# Patient Record
Sex: Male | Born: 1970 | Hispanic: Yes | Smoking: Never smoker
Health system: Southern US, Community
[De-identification: ages and names within clinical notes are randomized; demographics above are authoritative.]

## PROBLEM LIST (undated history)

## (undated) DIAGNOSIS — K2970 Gastritis, unspecified, without bleeding: Secondary | ICD-10-CM

## (undated) DIAGNOSIS — E785 Hyperlipidemia, unspecified: Secondary | ICD-10-CM

## (undated) DIAGNOSIS — E7800 Pure hypercholesterolemia, unspecified: Secondary | ICD-10-CM

## (undated) HISTORY — PX: APPENDECTOMY: SHX54

## (undated) HISTORY — PX: HERNIA REPAIR: SHX51

---

## 2018-06-08 ENCOUNTER — Encounter: Payer: Self-pay | Admitting: Emergency Medicine

## 2018-06-08 ENCOUNTER — Emergency Department: Payer: Self-pay

## 2018-06-08 ENCOUNTER — Other Ambulatory Visit: Payer: Self-pay

## 2018-06-08 ENCOUNTER — Emergency Department
Admission: EM | Admit: 2018-06-08 | Discharge: 2018-06-08 | Disposition: A | Discharge status: Home / Self Care | Payer: Self-pay | Attending: Emergency Medicine | Admitting: Emergency Medicine

## 2018-06-08 DIAGNOSIS — R079 Chest pain, unspecified: Secondary | ICD-10-CM | POA: Insufficient documentation

## 2018-06-08 LAB — CBC
HCT: 39.8 % (ref 39.0–52.0)
Hemoglobin: 13.6 g/dL (ref 13.0–17.0)
MCH: 30.1 pg (ref 26.0–34.0)
MCHC: 34.2 g/dL (ref 30.0–36.0)
MCV: 88.1 fL (ref 80.0–100.0)
Platelets: 251 10*3/uL (ref 150–400)
RBC: 4.52 MIL/uL (ref 4.22–5.81)
RDW: 12.4 % (ref 11.5–15.5)
WBC: 8 10*3/uL (ref 4.0–10.5)
nRBC: 0 % (ref 0.0–0.2)

## 2018-06-08 LAB — TROPONIN I: Troponin I: <0.03 ng/mL (ref <0.03)

## 2018-06-08 LAB — BASIC METABOLIC PANEL
Anion gap: 8 (ref 5–15)
BUN: 10 mg/dL (ref 6–20)
CO2: 24 mmol/L (ref 22–32)
Calcium: 8.6 mg/dL — ABNORMAL LOW (ref 8.9–10.3)
Chloride: 107 mmol/L (ref 98–111)
Creatinine, Ser: 0.7 mg/dL (ref 0.61–1.24)
GFR calc Af Amer: >60 mL/min (ref >60)
GFR calc non Af Amer: >60 mL/min (ref >60)
Glucose, Bld: 127 mg/dL — ABNORMAL HIGH (ref 70–99)
Potassium: 3.5 mmol/L (ref 3.5–5.1)
Sodium: 139 mmol/L (ref 135–145)

## 2018-06-08 NOTE — ED Triage Notes (Signed)
Patient ambulatory to triage with steady gait, without difficulty or distress noted, mask in place; per video interpreter, pt reports left upper CP, at times radiating into left arm for several years with no accomp symptoms; st he is wanting a physical and his cholesterol checked

## 2018-06-08 NOTE — ED Notes (Signed)
Patient is Jesus Moon. Asymptomatic. AAOX4.

## 2018-06-08 NOTE — ED Provider Notes (Signed)
[facility] Emergency Department Provider Note    First MD Initiated Contact with Patient 06/08/18 [phone]     (approximate)  I have reviewed the triage vital signs and the nursing notes.   HISTORY  Chief Complaint Chest Pain    HPI Katha Cabalndres Mederos Augustin SchoolingSantamaria is a 48 y.o. male with history of hypercholesterolemia with history of hyperlipidemia presents to the emergency department with complaint of nonradiating left chest pain for 5 years.  Patient denies any aggravating or alleviating factors.  Patient denies any dyspnea no diaphoresis.  Patient denies any nausea or vomiting.       History reviewed. No pertinent past medical history.  There are no active problems to display for this patient.   Past Surgical History:  Procedure Laterality Date  . APPENDECTOMY      Prior to Admission medications   Not on File    Allergies Patient has no known allergies.  No family history on file.  Social History Social History   Tobacco Use  . Smoking status: Never Smoker  . Smokeless tobacco: Never Used  Substance Use Topics  . Alcohol use: Not on file  . Drug use: Not on file    Review of Systems Constitutional: No fever/chills Eyes: No visual changes. ENT: No sore throat. Cardiovascular: Positive for chest pain. Respiratory: Denies shortness of breath. Gastrointestinal: No abdominal pain.  No nausea, no vomiting.  No diarrhea.  No constipation. Genitourinary: Negative for dysuria. Musculoskeletal: Negative for neck pain.  Negative for back pain. Integumentary: Negative for rash. Neurological: Negative for headaches, focal weakness or numbness.  ____________________________________________   PHYSICAL EXAM:  VITAL SIGNS: ED Triage Vitals  Enc Vitals Group     BP 06/08/18 0006 (!) 144/83     Pulse Rate 06/08/18 0006 (!) 54     Resp 06/08/18 0006 18     Temp 06/08/18 0006 98 F (36.7 C)     Temp Source 06/08/18 0006 Oral     SpO2  06/08/18 0006 97 %     Weight 06/08/18 0012 65.8 kg (145 lb)     Height 06/08/18 0012 1.651 m (5\' 5" )     Head Circumference --      Peak Flow --      Pain Score 06/08/18 0011 6     Pain Loc --      Pain Edu? --      Excl. in GC? --     Constitutional: Alert and oriented. Well appearing and in no acute distress. Eyes: Conjunctivae are normal.  Mouth/Throat: Mucous membranes are moist.  Oropharynx non-erythematous. Neck: No stridor.   Cardiovascular: Normal rate, regular rhythm. Good peripheral circulation. Grossly normal heart sounds. Respiratory: Normal respiratory effort.  No retractions. No audible wheezing. Gastrointestinal: Soft and nontender. No distention.  Musculoskeletal: No lower extremity tenderness nor edema. No gross deformities of extremities. Neurologic:  Normal speech and language. No gross focal neurologic deficits are appreciated.  Skin:  Skin is warm, dry and intact. No rash noted. Psychiatric: Mood and affect are normal. Speech and behavior are normal.  ____________________________________________   LABS (all labs ordered are listed, but only abnormal results are displayed)  Labs Reviewed  BASIC METABOLIC PANEL - Abnormal; Notable for the following components:      Result Value   Glucose, Bld 127 (*)    Calcium 8.6 (*)    All other components within normal limits  CBC  TROPONIN I   ____________________________________________  EKG  ED ECG REPORT  I, [place] Dewayne Shorter, the attending physician, personally viewed and interpreted this ECG.   Date: 06/08/2018  EKG Time: 12:18 AM  Rate: 54  Rhythm: Normal sinus rhythm  Axis: Normal  Intervals:Normal  ST&T Change: None  ____________________________________________  RADIOLOGY I, [place] Jaquisha Frech, personally viewed and evaluated these images (plain radiographs) as part of my medical decision making, as well as reviewing the written report by the radiologist.  ED MD interpretation: No active  cardiopulmonary disease.  Official radiology report(s): Dg Chest Port 1 View  Result Date: 06/08/2018 CLINICAL DATA:  Chest pain EXAM: PORTABLE CHEST 1 VIEW COMPARISON:  None. FINDINGS: The heart size and mediastinal contours are within normal limits. Both lungs are clear. The visualized skeletal structures are unremarkable. IMPRESSION: No active disease. Electronically Signed   By: Deatra Robinson M.D.   On: 06/08/2018 01:48     Procedures   ____________________________________________   INITIAL IMPRESSION / MDM / ASSESSMENT AND PLAN / ED COURSE  As part of my medical decision making, I reviewed the following data within the electronic MEDICAL RECORD NUMBER  48 year old male presented with above-stated history and physical exam secondary to chest pain considered possibly of CAD/MI however patient's EKG revealed no evidence of ischemia or infarction.  Troponin negative.  Chest x-ray likewise negative.  Patient referred to cardiology for further outpatient evaluation.  *Nyko Altamirano was evaluated in Emergency Department on 06/08/2018 for the symptoms described in the history of present illness. He was evaluated in the context of the global COVID-19 pandemic, which necessitated consideration that the patient might be at risk for infection with the SARS-CoV-2 virus that causes COVID-19. Institutional protocols and algorithms that pertain to the evaluation of patients at risk for COVID-19 are in a state of rapid change based on information released by regulatory bodies including the CDC and federal and state organizations. These policies and algorithms were followed during the patient's care in the ED.  Some ED evaluations and interventions may be delayed as a result of limited staffing during the pandemic.*    ____________________________________________  FINAL CLINICAL IMPRESSION(S) / ED DIAGNOSES  Final diagnoses:  Chest pain, unspecified type     MEDICATIONS GIVEN DURING THIS  VISIT:  Medications - No data to display   ED Discharge Orders    None       Note:  This document was prepared using Dragon voice recognition software and may include unintentional dictation errors.   Darci Current, MD 06/08/18 [phone]

## 2018-06-08 NOTE — ED Notes (Signed)
Pt to the er for answers to some chronic health questions. Pt states he has had high cholesterol and pain in his left chest for 5 years. Pt states he has pain tonight as well that comes and goes and rates it a 6 to a 7. Pt denis SOB, nausea, vomiting. Pt saw his doctor a few days ago and was given something for pain. Interpreter used.

## 2018-06-10 ENCOUNTER — Other Ambulatory Visit: Payer: Self-pay | Admitting: Internal Medicine

## 2018-06-10 DIAGNOSIS — I208 Other forms of angina pectoris: Secondary | ICD-10-CM

## 2018-06-14 ENCOUNTER — Other Ambulatory Visit: Payer: Self-pay | Admitting: Internal Medicine

## 2018-06-14 DIAGNOSIS — I208 Other forms of angina pectoris: Secondary | ICD-10-CM

## 2018-06-14 DIAGNOSIS — R0602 Shortness of breath: Secondary | ICD-10-CM

## 2018-06-16 ENCOUNTER — Ambulatory Visit: Payer: Self-pay

## 2018-06-20 ENCOUNTER — Ambulatory Visit: Admission: RE | Admit: 2018-06-20 | Payer: Self-pay | Source: Ambulatory Visit

## 2019-08-13 ENCOUNTER — Other Ambulatory Visit: Payer: Self-pay | Admitting: Internal Medicine

## 2019-08-13 DIAGNOSIS — R0602 Shortness of breath: Secondary | ICD-10-CM

## 2019-08-13 DIAGNOSIS — I208 Other forms of angina pectoris: Secondary | ICD-10-CM

## 2020-02-10 ENCOUNTER — Emergency Department: Payer: 59

## 2020-02-10 ENCOUNTER — Encounter: Payer: Self-pay | Admitting: Emergency Medicine

## 2020-02-10 ENCOUNTER — Emergency Department
Admission: EM | Admit: 2020-02-10 | Discharge: 2020-02-10 | Disposition: A | Discharge status: Home / Self Care | Payer: 59 | Attending: Emergency Medicine | Admitting: Emergency Medicine

## 2020-02-10 ENCOUNTER — Other Ambulatory Visit: Payer: Self-pay

## 2020-02-10 DIAGNOSIS — R519 Headache, unspecified: Secondary | ICD-10-CM | POA: Diagnosis present

## 2020-02-10 DIAGNOSIS — M542 Cervicalgia: Secondary | ICD-10-CM | POA: Diagnosis not present

## 2020-02-10 DIAGNOSIS — M79602 Pain in left arm: Secondary | ICD-10-CM | POA: Insufficient documentation

## 2020-02-10 DIAGNOSIS — R0789 Other chest pain: Secondary | ICD-10-CM | POA: Insufficient documentation

## 2020-02-10 HISTORY — DX: Hyperlipidemia, unspecified: E78.5

## 2020-02-10 LAB — CBC
HCT: 42.5 % (ref 39.0–52.0)
Hemoglobin: 14.8 g/dL (ref 13.0–17.0)
MCH: 30.2 pg (ref 26.0–34.0)
MCHC: 34.8 g/dL (ref 30.0–36.0)
MCV: 86.7 fL (ref 80.0–100.0)
Platelets: 229 10*3/uL (ref 150–400)
RBC: 4.9 MIL/uL (ref 4.22–5.81)
RDW: 13.2 % (ref 11.5–15.5)
WBC: 6.5 10*3/uL (ref 4.0–10.5)
nRBC: 0 % (ref 0.0–0.2)

## 2020-02-10 LAB — BASIC METABOLIC PANEL
Anion gap: 7 (ref 5–15)
BUN: 13 mg/dL (ref 6–20)
CO2: 24 mmol/L (ref 22–32)
Calcium: 8.8 mg/dL — ABNORMAL LOW (ref 8.9–10.3)
Chloride: 105 mmol/L (ref 98–111)
Creatinine, Ser: 0.63 mg/dL (ref 0.61–1.24)
GFR, Estimated: >60 mL/min (ref >60)
Glucose, Bld: 113 mg/dL — ABNORMAL HIGH (ref 70–99)
Potassium: 3.9 mmol/L (ref 3.5–5.1)
Sodium: 136 mmol/L (ref 135–145)

## 2020-02-10 LAB — TROPONIN I (HIGH SENSITIVITY)
Troponin I (High Sensitivity): 3 ng/L (ref <18)
Troponin I (High Sensitivity): 2 ng/L (ref <18)

## 2020-02-10 MED ORDER — SODIUM CHLORIDE 0.9 % IV BOLUS (SEPSIS)
1000.0000 mL | Freq: Once | INTRAVENOUS | Status: AC
  Administered 2020-02-10: 1000 mL via INTRAVENOUS

## 2020-02-10 MED ORDER — DIPHENHYDRAMINE HCL 50 MG/ML IJ SOLN
25.0000 mg | Freq: Once | INTRAMUSCULAR | Status: AC
  Administered 2020-02-10: 25 mg via INTRAVENOUS
  Filled 2020-02-10: qty 1

## 2020-02-10 MED ORDER — METOCLOPRAMIDE HCL 5 MG/ML IJ SOLN
10.0000 mg | Freq: Once | INTRAMUSCULAR | Status: AC
  Administered 2020-02-10: 10 mg via INTRAVENOUS
  Filled 2020-02-10: qty 2

## 2020-02-10 MED ORDER — KETOROLAC TROMETHAMINE 30 MG/ML IJ SOLN
30.0000 mg | Freq: Once | INTRAMUSCULAR | Status: AC
  Administered 2020-02-10: 30 mg via INTRAVENOUS
  Filled 2020-02-10: qty 1

## 2020-02-10 NOTE — Discharge Instructions (Signed)
You may alternate Tylenol 1000 mg every 6 hours as needed for pain, fever and Ibuprofen 800 mg every 8 hours as needed for pain, fever.  Please take Ibuprofen with food.  Do not take more than 4000 mg of Tylenol (acetaminophen) in a 24 hour period.   Steps to find a Primary Care Provider (PCP):  Call [phone] or [phone] to access "[facility] Find a Doctor Service."  2.  You may also go on the [facility] website at [email or web address]  

## 2020-02-10 NOTE — ED Notes (Signed)
Pt also reports he has a headache and felt lower lip to left side like pins on just one area of the lip

## 2020-02-10 NOTE — ED Provider Notes (Signed)
[facility] Emergency Department Provider Note  ____________________________________________   Event Date/Time   First MD Initiated Contact with Patient 02/10/20 [phone]     (approximate)  I have reviewed the triage vital signs and the nursing notes.   HISTORY  Chief Complaint Chest Pain    HPI Jesus Moon is a 50 y.o. male hyperlipidemia who presents to the emergency department with intermittent throbbing left-sided headaches that radiate down the left arm and neck and across his chest.  He reports feeling burning in his fingertips and toes.  States he had tingling in his lips yesterday.  No current numbness, tingling or weakness.  No neck stiffness, fever.  No difficulty breathing.  Was seen at [facility] for the same and had a reassuring work-up including negative troponins and negative CT of the head.  He states he has been taking Tylenol and ibuprofen and this has been improving his symptoms.  No history of ACS, PE, DVT.  No lower extremity swelling or pain.        Past Medical History:  Diagnosis Date  . Hyperlipemia     There are no problems to display for this patient.   Past Surgical History:  Procedure Laterality Date  . APPENDECTOMY      Prior to Admission medications   Not on File    Allergies Amoxicillin  No family history on file.  Social History Social History   Tobacco Use  . Smoking status: Never Smoker  . Smokeless tobacco: Never Used    Review of Systems Constitutional: No fever. Eyes: No visual changes. ENT: No sore throat. Cardiovascular: Denies chest pain. Respiratory: Denies shortness of breath. Gastrointestinal: No nausea, vomiting, diarrhea. Genitourinary: Negative for dysuria. Musculoskeletal: Negative for back pain. Skin: Negative for rash. Neurological: Negative for focal weakness or numbness.  ____________________________________________   PHYSICAL EXAM:  VITAL SIGNS: ED Triage Vitals   Enc Vitals Group     BP 02/10/20 0012 137/90     Pulse Rate 02/10/20 0012 (!) 54     Resp 02/10/20 0012 20     Temp 02/10/20 0012 98.2 F (36.8 C)     Temp Source 02/10/20 0012 Oral     SpO2 02/10/20 0012 97 %     Weight 02/10/20 0013 145 lb 1 oz (65.8 kg)     Height 02/10/20 0013 5\' 5"  (1.651 m)     Head Circumference --      Peak Flow --      Pain Score 02/10/20 0012 8     Pain Loc --      Pain Edu? --      Excl. in GC? --    CONSTITUTIONAL: Alert and oriented and responds appropriately to questions. Well-appearing; well-nourished HEAD: Normocephalic EYES: Conjunctivae clear, pupils appear equal, EOM appear intact ENT: normal nose; moist mucous membranes NECK: Supple, normal ROM, no midline spinal tenderness or step-off or deformity, no meningismus, no tenderness over the occiput bilaterally CARD: RRR; S1 and S2 appreciated; no murmurs, no clicks, no rubs, no gallops CHEST:  Chest wall is tender to palpation which reproduces his pain.  No crepitus, ecchymosis, erythema, warmth, rash or other lesions present.   RESP: Normal chest excursion without splinting or tachypnea; breath sounds clear and equal bilaterally; no wheezes, no rhonchi, no rales, no hypoxia or respiratory distress, speaking full sentences ABD/GI: Normal bowel sounds; non-distended; soft, non-tender, no rebound, no guarding, no peritoneal signs, no hepatosplenomegaly BACK: The back appears normal EXT: Normal ROM in  all joints; no deformity noted, no edema; no cyanosis SKIN: Normal color for age and race; warm; no rash on exposed skin NEURO: Moves all extremities equally, normal sensation diffusely, normal speech, cranial nerves II through XII intact PSYCH: The patient's mood and manner are appropriate.  ____________________________________________   LABS (all labs ordered are listed, but only abnormal results are displayed)  Labs Reviewed  BASIC METABOLIC PANEL - Abnormal; Notable for the following components:       Result Value   Glucose, Bld 113 (*)    Calcium 8.8 (*)    All other components within normal limits  CBC  TROPONIN I (HIGH SENSITIVITY)  TROPONIN I (HIGH SENSITIVITY)   ____________________________________________  EKG   EKG Interpretation  Date/Time:  Saturday February 10 2020 00:02:02 EST Ventricular Rate:  50 PR Interval:  170 QRS Duration: 100 QT Interval:  428 QTC Calculation: 390 R Axis:   21 Text Interpretation: Sinus bradycardia Otherwise normal ECG No significant change since last tracing Confirmed by Rochele Raring [phone]) on 02/10/2020 3:30:57 AM       ____________________________________________  RADIOLOGY Normajean Baxter Chaniece Barbato, personally viewed and evaluated these images (plain radiographs) as part of my medical decision making, as well as reviewing the written report by the radiologist.  ED MD interpretation: Chest x-ray shows no acute abnormality.  Official radiology report(s): DG Chest 2 View  Result Date: 02/10/2020 CLINICAL DATA:  Chest pain EXAM: CHEST - 2 VIEW COMPARISON:  06/08/2018 FINDINGS: The heart size and mediastinal contours are within normal limits. Both lungs are clear. The visualized skeletal structures are unremarkable. IMPRESSION: Normal study. Electronically Signed   By: Charlett Nose M.D.   On: 02/10/2020 00:34    ____________________________________________   PROCEDURES  Procedure(s) performed (including Critical Care):  None  ____________________________________________   INITIAL IMPRESSION / ASSESSMENT AND PLAN / ED COURSE  As part of my medical decision making, I reviewed the following data within the electronic MEDICAL RECORD NUMBER Nursing notes reviewed and incorporated, Interpreter needed, Labs reviewed, EKG interpreted bradycardia, Old EKG reviewed, Old chart reviewed, Radiograph reviewed and Notes from prior ED visits         Patient here with headache and very atypical chest pain.  He was seen at [facility] 2 days ago  for the same and had normal labs, chest x-ray and head CT.  Troponin x2 normal here.  Chest x-ray clear.  EKG nonischemic.  Chest pain seems to be musculoskeletal in nature.  Doubt ACS, PE, dissection.  No signs of volume overload.  He is also complaining of a throbbing left-sided headache.  Improving with Tylenol and Motrin.  No focal neurologic deficits.  I do not feel he needs repeat head imaging today.  Doubt stroke, CVT, intracranial hemorrhage, meningitis.  Will give migraine cocktail and reassess.    5:30 AM  Pt reports headache and chest pain completely resolved after migraine cocktail.  Will give outpatient PCP follow-up information.  Discussed return precautions.  Recommended continuing Tylenol Motrin as needed for pain.   At this time, I do not feel there is any life-threatening condition present. I have reviewed, interpreted and discussed all results (EKG, imaging, lab, urine as appropriate) and exam findings with patient/family. I have reviewed nursing notes and appropriate previous records.  I feel the patient is safe to be discharged home without further emergent workup and can continue workup as an outpatient as needed. Discussed usual and customary return precautions. Patient/family verbalize understanding and are comfortable with this plan.  Outpatient follow-up has been provided as needed. All questions have been answered.   ____________________________________________   FINAL CLINICAL IMPRESSION(S) / ED DIAGNOSES  Final diagnoses:  Atypical chest pain  Generalized headache     ED Discharge Orders    None      *Please note:  Jesus Moon was evaluated in Emergency Department on 02/10/2020 for the symptoms described in the history of present illness. He was evaluated in the context of the global COVID-19 pandemic, which necessitated consideration that the patient might be at risk for infection with the SARS-CoV-2 virus that causes COVID-19. Institutional  protocols and algorithms that pertain to the evaluation of patients at risk for COVID-19 are in a state of rapid change based on information released by regulatory bodies including the CDC and federal and state organizations. These policies and algorithms were followed during the patient's care in the ED.  Some ED evaluations and interventions may be delayed as a result of limited staffing during and the pandemic.*   Note:  This document was prepared using Dragon voice recognition software and may include unintentional dictation errors.   Milo Solana, Layla Maw, DO 02/10/20 [phone]

## 2020-02-10 NOTE — ED Triage Notes (Signed)
Pt reports he developed sharp right chest pain, pt reports he went to another hospital yesterday for same symptoms. Pt reports prior to arrival he took Ibuprofen for his chest pain. Pt talks in complete sentences no respiratory distress noted denies any other symptoms

## 2020-05-04 ENCOUNTER — Encounter: Payer: Self-pay | Admitting: Emergency Medicine

## 2020-05-04 ENCOUNTER — Emergency Department: Payer: 59

## 2020-05-04 ENCOUNTER — Emergency Department
Admission: EM | Admit: 2020-05-04 | Discharge: 2020-05-05 | Disposition: A | Discharge status: Home / Self Care | Payer: 59 | Attending: Emergency Medicine | Admitting: Emergency Medicine

## 2020-05-04 ENCOUNTER — Other Ambulatory Visit: Payer: Self-pay

## 2020-05-04 DIAGNOSIS — J309 Allergic rhinitis, unspecified: Secondary | ICD-10-CM | POA: Diagnosis not present

## 2020-05-04 DIAGNOSIS — S5001XA Contusion of right elbow, initial encounter: Secondary | ICD-10-CM | POA: Insufficient documentation

## 2020-05-04 DIAGNOSIS — X500XXA Overexertion from strenuous movement or load, initial encounter: Secondary | ICD-10-CM | POA: Insufficient documentation

## 2020-05-04 DIAGNOSIS — M545 Low back pain, unspecified: Secondary | ICD-10-CM | POA: Diagnosis not present

## 2020-05-04 DIAGNOSIS — M546 Pain in thoracic spine: Secondary | ICD-10-CM | POA: Insufficient documentation

## 2020-05-04 DIAGNOSIS — T148XXA Other injury of unspecified body region, initial encounter: Secondary | ICD-10-CM

## 2020-05-04 DIAGNOSIS — S199XXA Unspecified injury of neck, initial encounter: Secondary | ICD-10-CM | POA: Diagnosis present

## 2020-05-04 DIAGNOSIS — Y990 Civilian activity done for income or pay: Secondary | ICD-10-CM | POA: Insufficient documentation

## 2020-05-04 DIAGNOSIS — S161XXA Strain of muscle, fascia and tendon at neck level, initial encounter: Secondary | ICD-10-CM | POA: Insufficient documentation

## 2020-05-04 MED ORDER — KETOROLAC TROMETHAMINE 30 MG/ML IJ SOLN
30.0000 mg | Freq: Once | INTRAMUSCULAR | Status: AC
  Administered 2020-05-04: 30 mg via INTRAMUSCULAR
  Filled 2020-05-04: qty 1

## 2020-05-04 MED ORDER — CYCLOBENZAPRINE HCL 10 MG PO TABS
10.0000 mg | ORAL_TABLET | Freq: Once | ORAL | Status: AC
  Administered 2020-05-04: 10 mg via ORAL
  Filled 2020-05-04: qty 1

## 2020-05-04 NOTE — ED Triage Notes (Signed)
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 Pt talks in complete setences no distress noted

## 2020-05-04 NOTE — Discharge Instructions (Addendum)
Your exam and x-rays are normal at this time.  Take the prescription muscle relaxants as directed.  You should also use the prescription nasal steroid for your seasonal allergies.  Follow-up with one of the local Dental providers listed below.  See your primary provider for ongoing management of your other symptoms.  Su examen y radiografas son normales en este momento. Tome los relajantes musculares recetados segn las indicaciones. Tambin debe usar el esteroide nasal recetado para sus Theatre manager. Haga un seguimiento con uno de los proveedores dentales locales que se enumeran a continuacin. Consulte a su proveedor primario para el control continuo de sus otros sntomas. OPTIONS FOR DENTAL FOLLOW UP CARE  [place] Department of Health and Human Services - Local Safety Net Dental Clinics TripDoors.com.htm   [facility] [phone])  Sharl Ma [phone])  [place] [phone] ext [phone])  [facility] [phone])  [facility] [phone]) This clinic caters to the indigent population and is on a lottery system. Location: [employer] [facility], [employer], [address], [place] Clinic Hours: Wednesdays from 6pm - 9pm, patients seen by a lottery system. For dates, call or go to [email or web address] Services: Cleanings, fillings and simple extractions. Payment Options: DENTAL WORK IS FREE OF CHARGE. Bring proof of income or support. Best way to get seen: Arrive at 5:15 pm - this is a lottery, NOT first come/first serve, so arriving earlier will not increase your chances of being seen.     [facility] [phone] Select option 1 for emergencies   Location: [facility], [place], [address], [place] Clinic Hours: No walk-ins accepted - call the day before to schedule an  appointment. Check in times are 9:30 am and 1:30 pm. Services: Simple extractions, temporary fillings, pulpectomy/pulp debridement, uncomplicated abscess drainage. Payment Options: PAYMENT IS DUE AT THE TIME OF SERVICE.  Fee is usually $100-200, additional surgical procedures (e.g. abscess drainage) may be extra. Cash, checks, Visa/MasterCard accepted.  Can file Medicaid if patient is covered for dental - patient should call case worker to check. No discount for [facility] patients. Best way to get seen: MUST call the day before and get onto the schedule. Can usually be seen the next 1-2 days. No walk-ins accepted.     [facility] [phone]   Location: [facility], [address], [place] Clinic Hours: M, W, Th, F 8am or 1:30pm, Tues 9a or 1:30 - first come/first served. Services: Simple extractions, temporary fillings, uncomplicated abscess drainage.  You do not need to be an [facility] resident. Payment Options: PAYMENT IS DUE AT THE TIME OF SERVICE. Dental insurance, otherwise sliding scale - bring proof of income or support. Depending on income and treatment needed, cost is usually $50-200. Best way to get seen: Arrive early as it is first come/first served.     [facility] [phone]   Location: [address] Clinic Hours: Mon-Thu 8a-5p Services: Most basic dental services including extractions and fillings. Payment Options: PAYMENT IS DUE AT THE TIME OF SERVICE. Sliding scale, up to 50% off - bring proof if income or support. Medicaid with dental option accepted. Best way to get seen: Call to schedule an appointment, can usually be seen within 2 weeks OR they will try to see walk-ins - show up at 8a or 2p (you may have to wait).     [facility] [phone] [email or web address]  COUNTY RESIDENTS ONLY   Location: [employer], [address]  [place], [place] [address] Clinic Hours: By appointment only. Monday - Thursday 8am-5pm, Friday 8am-12pm Services: Cleanings, fillings, extractions. Payment Options: PAYMENT IS DUE AT THE TIME OF SERVICE. Cash, Visa or MasterCard. Sliding scale - $30 minimum per service. Best way to get seen: Come in to office, complete packet and make an appointment - need proof of income or support monies for each household member and proof of [facility] residence. Usually takes about a month to get in.     [facility] [phone]   Location: [address], [facility] Clinic Hours: Walk-in Urgent Care Dental Services are offered Monday-Friday mornings only. The numbers of emergencies accepted daily is limited to the number of providers available. Maximum 15 - Mondays, Wednesdays & Thursdays Maximum 10 - Tuesdays & Fridays Services: You do not need to be a [facility] resident to be seen for a dental emergency. Emergencies are defined as pain, swelling, abnormal bleeding, or dental trauma. Walkins will receive x-rays if needed. NOTE: Dental cleaning is not an emergency. Payment Options: PAYMENT IS DUE AT THE TIME OF SERVICE. Minimum co-pay is $40.00 for uninsured patients. Minimum co-pay is $3.00 for Medicaid with dental coverage. Dental Insurance is accepted and must be presented at time of visit. Medicare does not cover dental. Forms of payment: Cash, credit card, checks. Best way to get seen: If not previously registered with the clinic, walk-in dental registration begins at 7:15 am and is on a first come/first serve basis. If previously registered with the clinic, call to make an appointment.     [facility] [phone] [place] RESIDENTS ONLY   Location: [address], [place], [place] Clinic Hours: Mon-Thu 10a-2p Services: Extractions only! Payment Options: FREE (donations accepted) - bring proof of income or support Best way to  get seen: Call and schedule an appointment OR come at 8am on the 1st Monday of every month (except for holidays) when it is first come/first served.     [facility] [phone]   Location: [address] [place], [place] Clinic Hours: Friday mornings Services, Payment Options, Best way to get seen: Call for info

## 2020-05-04 NOTE — ED Provider Notes (Signed)
[facility] Emergency Department Provider Note ____________________________________________  Time seen: 2138  I have reviewed the triage vital signs and the nursing notes.  HISTORY  Chief Complaint  Back Pain  History limited by Spanish language.  Interpreter present for interview and exam.  HPI Jesus Moon is a 50 y.o. male presents to the ED for evaluation of back pain. 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 He denies any frank injury, chest pain, SOB, or distal paresthesias.  He describes onset of symmetric upper and in mid back muscle spasm and tightness.  It occurred after he came home and rested today.  Past Medical History:  Diagnosis Date  . Hyperlipemia     There are no problems to display for this patient.   Past Surgical History:  Procedure Laterality Date  . APPENDECTOMY      Prior to Admission medications   Medication Sig Start Date End Date Taking? Authorizing Provider  cyclobenzaprine (FLEXERIL) 5 MG tablet Take 1 tablet (5 mg total) by mouth at bedtime. 05/04/20  Yes Xzandria Clevinger, Charlesetta Ivory, PA-C  fluticasone (FLONASE) 50 MCG/ACT nasal spray Place 2 sprays into both nostrils daily. 05/04/20  Yes Jaedan Huttner, Charlesetta Ivory, PA-C    Allergies Amoxicillin  History reviewed. No pertinent family history.  Social History Social History   Tobacco Use  . Smoking status: Never Smoker  . Smokeless tobacco: Never Used    Review of Systems  Constitutional: Negative for fever. Eyes: Negative for visual changes. ENT: Negative for sore throat. Cardiovascular: Negative for chest pain. Respiratory: Negative for shortness of breath. Gastrointestinal: Negative for abdominal pain, vomiting and diarrhea. Genitourinary: Negative for dysuria. Musculoskeletal: Positive for upper and lower back pain. Skin: Negative for rash. Neurological: Negative for  headaches, focal weakness or numbness. ____________________________________________  PHYSICAL EXAM:  VITAL SIGNS: ED Triage Vitals  Enc Vitals Group     BP 05/04/20 1956 (!) 137/92     Pulse Rate 05/04/20 1956 66     Resp 05/04/20 1956 20     Temp 05/04/20 1956 98.4 F (36.9 C)     Temp Source 05/04/20 1956 Oral     SpO2 05/04/20 1956 97 %     Weight 05/04/20 1952 156 lb (70.8 kg)     Height 05/04/20 1952 5\' 5"  (1.651 m)     Head Circumference --      Peak Flow --      Pain Score 05/04/20 1951 10     Pain Loc --      Pain Edu? --      Excl. in GC? --     Constitutional: Alert and oriented. Well appearing and in no distress. Head: Normocephalic and atraumatic. Eyes: Conjunctivae are normal. PERRL. Normal extraocular movements Ears: Canals clear. TMs intact bilaterally. Nose: No congestion/rhinorrhea/epistaxis.  No deviation to the right.  Turbinates are erythematous but moist and without edema. Mouth/Throat: Mucous membranes are moist. Neck: Supple.  Range of motion without crepitus.  No midline tenderness is appreciated.  Full active range of motion is noted. Cardiovascular: Normal rate, regular rhythm. Normal distal pulses. Respiratory: Normal respiratory effort. No wheezes/rales/rhonchi. Gastrointestinal: Soft and nontender. No distention. Musculoskeletal: Spinal alignment without midline tenderness, spasm, deformity, or step-off.  Bilateral muscle tenderness reproducible on exam.  Full active range of motion with normal strength and resistance testing of the upper extremities.  Nontender with normal range  of motion in all extremities.  Neurologic: Cranial nerves II to XII grossly intact.  Normal UE/LE DTRs bilaterally.  Normal gait without ataxia. Normal speech and language. No gross focal neurologic deficits are appreciated. Skin:  Skin is warm, dry and intact. No rash noted. Psychiatric: Mood and affect are normal. Patient exhibits appropriate insight and  judgment. ____________________________________________   RADIOLOGY  DG Cervical Spine  IMPRESSION: No acute osseous abnormalities.  Cervical spondylitic changes as above.  DG Right Elbow  IMPRESSION: Enthesopathic spur upon the olecranon. Lucency through the base could reflect age-indeterminate fragmentation with overlying soft tissue thickening. Correlate for point tenderness to assess for acuity.  No other acute osseous or soft tissue abnormality. ____________________________________________  PROCEDURES  Cyclobenzaprine 10 mg PO Toradol 30 mg IM  Procedures ____________________________________________   INITIAL IMPRESSION / ASSESSMENT AND PLAN / ED COURSE  As part of my medical decision making, I reviewed the following data within the electronic MEDICAL RECORD NUMBER Old chart reviewed and Notes from prior ED visits     Patient ED evaluation of symmetric mid and low back pain following excessive physical exertion at work today.  Patient's exam is overall benign and reassuring at this time.  No acute neuromuscular efforts are appreciated.  He will be discharged with a prescription for cyclobenzaprine to take as needed.  Take over-the-counter Tylenol and Motrin as needed for pain relief.  You prescription for the Flonase is also provided at his benefit.  Patient is also given referrals as requested, to ENT for septal deviation.  He is also requesting a referral to a dental provider.  Jesus Moon was evaluated in Emergency Department on 05/05/2020 for the symptoms described in the history of present illness. He was evaluated in the context of the global COVID-19 pandemic, which necessitated consideration that the patient might be at risk for infection with the SARS-CoV-2 virus that causes COVID-19. Institutional protocols and algorithms that pertain to the evaluation of patients at risk for COVID-19 are in a state of rapid change based on information released by  regulatory bodies including the CDC and federal and state organizations. These policies and algorithms were followed during the patient's care in the ED. ____________________________________________  FINAL CLINICAL IMPRESSION(S) / ED DIAGNOSES  Final diagnoses:  Muscle strain  Contusion of right elbow, initial encounter  Allergic rhinitis, unspecified seasonality, unspecified trigger      Karmen Stabs, Charlesetta Ivory, PA-C 05/05/20 0009    Chesley Noon, MD 05/09/20 [phone]

## 2020-05-04 NOTE — ED Notes (Signed)
Jesus Moon spanish interpreter here for evaluation and assessment and possible discharge.

## 2020-05-05 MED ORDER — FLUTICASONE PROPIONATE 50 MCG/ACT NA SUSP: 2.0000 | Freq: Every day | NASAL | 0 refills | Status: AC

## 2020-05-05 MED ORDER — CYCLOBENZAPRINE HCL 5 MG PO TABS: 5.0000 mg | ORAL_TABLET | Freq: Every day | ORAL | 0 refills | Status: DC

## 2020-06-30 ENCOUNTER — Other Ambulatory Visit: Payer: Self-pay

## 2020-06-30 ENCOUNTER — Emergency Department
Admission: EM | Admit: 2020-06-30 | Discharge: 2020-06-30 | Disposition: A | Discharge status: Home / Self Care | Payer: 59 | Attending: Student in an Organized Health Care Education/Training Program | Admitting: Student in an Organized Health Care Education/Training Program

## 2020-06-30 DIAGNOSIS — L509 Urticaria, unspecified: Secondary | ICD-10-CM | POA: Insufficient documentation

## 2020-06-30 HISTORY — DX: Pure hypercholesterolemia, unspecified: E78.00

## 2020-06-30 MED ORDER — FAMOTIDINE 20 MG PO TABS
40.0000 mg | ORAL_TABLET | Freq: Once | ORAL | Status: AC
  Administered 2020-06-30: 40 mg via ORAL
  Filled 2020-06-30: qty 2

## 2020-06-30 MED ORDER — FAMOTIDINE 20 MG PO TABS: 20.0000 mg | ORAL_TABLET | Freq: Two times a day (BID) | ORAL | 1 refills | Status: AC

## 2020-06-30 MED ORDER — DIPHENHYDRAMINE HCL 25 MG PO CAPS: 25.0000 mg | ORAL_CAPSULE | Freq: Four times a day (QID) | ORAL | 0 refills | Status: AC | PRN

## 2020-06-30 MED ORDER — DIPHENHYDRAMINE HCL 25 MG PO CAPS
25.0000 mg | ORAL_CAPSULE | Freq: Once | ORAL | Status: AC
  Administered 2020-06-30: 25 mg via ORAL
  Filled 2020-06-30: qty 1

## 2020-06-30 MED ORDER — DEXAMETHASONE 6 MG PO TABS
10.0000 mg | ORAL_TABLET | Freq: Once | ORAL | Status: AC
  Administered 2020-06-30: 10 mg via ORAL
  Filled 2020-06-30: qty 1

## 2020-06-30 NOTE — ED Provider Notes (Signed)
[facility]-EMERGENCY DEPARTMENT  ____________________________________________  Time seen: Approximately 6:16 PM  I have reviewed the triage vital signs and the nursing notes.   HISTORY  Chief Complaint Headache   Historian Patient     HPI Jesus Moon is a 50 y.o. male presents to the emergency department with urticaria of the scalp and thigh.  Patient states that symptoms started yesterday.  His urticaria of the thigh resolved but patient still feels like he has urticaria of the scalp.  He has not attempted any alleviating measures at home.  No chest pain, chest tightness, cough, vomiting or diarrhea at home.  No prior history of anaphylaxis.   Past Medical History:  Diagnosis Date   High cholesterol    Hyperlipemia      Immunizations up to date:  Yes.     Past Medical History:  Diagnosis Date   High cholesterol    Hyperlipemia     There are no problems to display for this patient.   Past Surgical History:  Procedure Laterality Date   APPENDECTOMY     HERNIA REPAIR      Prior to Admission medications   Medication Sig Start Date End Date Taking? Authorizing Provider  diphenhydrAMINE (BENADRYL ALLERGY) 25 mg capsule Take 1 capsule (25 mg total) by mouth every 6 (six) hours as needed for up to 5 days. 06/30/20 07/05/20 Yes Pia Mau M, PA-C  famotidine (PEPCID) 20 MG tablet Take 1 tablet (20 mg total) by mouth 2 (two) times daily for 7 days. 06/30/20 07/07/20 Yes Pia Mau M, PA-C  cyclobenzaprine (FLEXERIL) 5 MG tablet Take 1 tablet (5 mg total) by mouth at bedtime. 05/04/20   Menshew, Charlesetta Ivory, PA-C  fluticasone (FLONASE) 50 MCG/ACT nasal spray Place 2 sprays into both nostrils daily. 05/04/20   Menshew, Charlesetta Ivory, PA-C    Allergies Amoxicillin  History reviewed. No pertinent family history.  Social History Social History   Tobacco Use   Smoking status: Never   Smokeless tobacco: Never     Review of Systems  Constitutional:  No fever/chills Eyes:  No discharge ENT: No upper respiratory complaints. Respiratory: no cough. No SOB/ use of accessory muscles to breath Gastrointestinal:   No nausea, no vomiting.  No diarrhea.  No constipation. Musculoskeletal: Negative for musculoskeletal pain. Skin: Patient has urticaria.     ____________________________________________   PHYSICAL EXAM:  VITAL SIGNS: ED Triage Vitals  Enc Vitals Group     BP 06/30/20 1651 (!) 147/91     Pulse Rate 06/30/20 1651 63     Resp 06/30/20 1651 16     Temp 06/30/20 1651 98.5 F (36.9 C)     Temp Source 06/30/20 1651 Oral     SpO2 06/30/20 1651 98 %     Weight 06/30/20 1653 160 lb (72.6 kg)     Height 06/30/20 1653 5\' 5"  (1.651 m)     Head Circumference --      Peak Flow --      Pain Score 06/30/20 1652 10     Pain Loc --      Pain Edu? --      Excl. in GC? --      Constitutional: Alert and oriented. Well appearing and in no acute distress. Eyes: Conjunctivae are normal. PERRL. EOMI. Head: Atraumatic. ENT:      Nose: No congestion/rhinnorhea.      Mouth/Throat: Mucous membranes are moist.  Neck: No stridor.  No cervical spine tenderness to palpation. Cardiovascular: Normal  rate, regular rhythm. Normal S1 and S2.  Good peripheral circulation. Respiratory: Normal respiratory effort without tachypnea or retractions. Lungs CTAB. Good air entry to the bases with no decreased or absent breath sounds Gastrointestinal: Bowel sounds x 4 quadrants. Soft and nontender to palpation. No guarding or rigidity. No distention. Musculoskeletal: Full range of motion to all extremities. No obvious deformities noted Neurologic:  Normal for age. No gross focal neurologic deficits are appreciated.  Skin: Patient has urticaria scalp. Psychiatric: Mood and affect are normal for age. Speech and behavior are normal.   ____________________________________________   LABS (all labs ordered are listed, but only abnormal results are  displayed)  Labs Reviewed - No data to display ____________________________________________  EKG   ____________________________________________  RADIOLOGY   No results found.  ____________________________________________    PROCEDURES  Procedure(s) performed:     Procedures     Medications  diphenhydrAMINE (BENADRYL) capsule 25 mg (has no administration in time range)  famotidine (PEPCID) tablet 40 mg (has no administration in time range)  dexamethasone (DECADRON) tablet 10 mg (has no administration in time range)     ____________________________________________   INITIAL IMPRESSION / ASSESSMENT AND PLAN / ED COURSE  Pertinent labs & imaging results that were available during my care of the patient were reviewed by me and considered in my medical decision making (see chart for details).      Assessment and Plan:  Urticaria 50 year old male presents to the emergency department after he developed hives last night.  Vital signs were reassuring at triage.  Mild urticaria of scalp visualized but no urticaria proximal thigh.  We will treat with Decadron, Benadryl and famotidine.  Patient was discharged with Benadryl and famotidine and advised to return to the emergency department if symptoms worsen.  All patient questions were answered.     ____________________________________________  FINAL CLINICAL IMPRESSION(S) / ED DIAGNOSES  Final diagnoses:  Urticaria      NEW MEDICATIONS STARTED DURING THIS VISIT:  ED Discharge Orders          Ordered    diphenhydrAMINE (BENADRYL ALLERGY) 25 mg capsule  Every 6 hours PRN        06/30/20 1812    famotidine (PEPCID) 20 MG tablet  2 times daily        06/30/20 1812                This chart was dictated using voice recognition software/Dragon. Despite best efforts to proofread, errors can occur which can change the meaning. Any change was purely unintentional.     Orvil Feil, PA-C 06/30/20  1821    Willy Eddy, MD 06/30/20 2008

## 2020-06-30 NOTE — Discharge Instructions (Addendum)
Take 25 mg of Benadryl every 8 hours until symptoms completely resolved. Take 40 mg of Pepcid daily.

## 2020-06-30 NOTE — ED Triage Notes (Addendum)
Pt arrives to ER c/o of food allergies. States yesterday ate at [employer] and states noticed hives on body. C/o of bump to top of head that hurts and also pain into his neck. C/o of head pain from "hives". No hives noted to arms by this RN.  Small bump noted to where pt is stating. Pt states took baby asa, pain pill, and put bengay on area. A&O, ambulatory.

## 2020-07-24 ENCOUNTER — Emergency Department: Payer: 59

## 2020-07-24 ENCOUNTER — Other Ambulatory Visit: Payer: Self-pay

## 2020-07-24 ENCOUNTER — Encounter: Payer: Self-pay | Admitting: Emergency Medicine

## 2020-07-24 ENCOUNTER — Emergency Department
Admission: EM | Admit: 2020-07-24 | Discharge: 2020-07-24 | Disposition: A | Discharge status: Home / Self Care | Payer: 59 | Attending: Emergency Medicine | Admitting: Emergency Medicine

## 2020-07-24 DIAGNOSIS — M542 Cervicalgia: Secondary | ICD-10-CM | POA: Insufficient documentation

## 2020-07-24 DIAGNOSIS — M25511 Pain in right shoulder: Secondary | ICD-10-CM | POA: Insufficient documentation

## 2020-07-24 DIAGNOSIS — M549 Dorsalgia, unspecified: Secondary | ICD-10-CM | POA: Insufficient documentation

## 2020-07-24 HISTORY — DX: Gastritis, unspecified, without bleeding: K29.70

## 2020-07-24 MED ORDER — TIZANIDINE HCL 4 MG PO TABS: 4.0000 mg | ORAL_TABLET | Freq: Three times a day (TID) | ORAL | 0 refills | Status: AC

## 2020-07-24 MED ORDER — CETIRIZINE HCL 10 MG PO TABS: 10.0000 mg | ORAL_TABLET | Freq: Every day | ORAL | 0 refills | Status: AC

## 2020-07-24 NOTE — ED Provider Notes (Signed)
[facility] Emergency Department Provider Note ____________________________________________  Time seen: Approximately 4:00 PM  I have reviewed the triage vital signs and the nursing notes.   HISTORY  Chief Complaint Back Pain    HPI Jesus Moon is a 50 y.o. male who presents to the emergency department for evaluation and treatment of right side neck pain that extends into the right shoulder blade and back.    Past Medical History:  Diagnosis Date   Gastritis    High cholesterol    Hyperlipemia     There are no problems to display for this patient.   Past Surgical History:  Procedure Laterality Date   APPENDECTOMY     HERNIA REPAIR      Prior to Admission medications   Medication Sig Start Date End Date Taking? Authorizing Provider  cetirizine (ZYRTEC ALLERGY) 10 MG tablet Take 1 tablet (10 mg total) by mouth daily. 07/24/20 07/24/21 Yes Joniah Bednarski B, FNP  tiZANidine (ZANAFLEX) 4 MG tablet Take 1 tablet (4 mg total) by mouth 3 (three) times daily. 07/24/20  Yes Carolyn Jesus B, FNP  diphenhydrAMINE (BENADRYL ALLERGY) 25 mg capsule Take 1 capsule (25 mg total) by mouth every 6 (six) hours as needed for up to 5 days. 06/30/20 07/05/20  Orvil Feil, PA-C  famotidine (PEPCID) 20 MG tablet Take 1 tablet (20 mg total) by mouth 2 (two) times daily for 7 days. 06/30/20 07/07/20  Orvil Feil, PA-C  fluticasone (FLONASE) 50 MCG/ACT nasal spray Place 2 sprays into both nostrils daily. 05/04/20   Menshew, Charlesetta Ivory, PA-C    Allergies Amoxicillin  No family history on file.  Social History Social History   Tobacco Use   Smoking status: Never   Smokeless tobacco: Never    Review of Systems Constitutional: Negative for fever. Cardiovascular: Negative for chest pain. Respiratory: Negative for shortness of breath. Musculoskeletal: Positive for neck/back pain. Skin: Negative for open wounds or lesions.  Neurological: Negative for  decrease in sensation  ____________________________________________   PHYSICAL EXAM:  VITAL SIGNS: ED Triage Vitals [07/24/20 1439]  Enc Vitals Group     BP (!) 145/88     Pulse Rate 72     Resp 16     Temp 98.6 F (37 C)     Temp Source Oral     SpO2 98 %     Weight 159 lb 13.3 oz (72.5 kg)     Height 5\' 5"  (1.651 m)     Head Circumference      Peak Flow      Pain Score 8     Pain Loc      Pain Edu?      Excl. in GC?     Constitutional: Alert and oriented. Well appearing and in no acute distress. Eyes: Conjunctivae are clear without discharge or drainage Head: Atraumatic Neck: Decreased ROM to the right Respiratory: No cough. Respirations are even and unlabored. Musculoskeletal: Tender over the right lateral cervical area into the subscapular surface. Neurologic: Awake, alert, and oriented.  Skin: No open wounds or lesions  Psychiatric: Affect and behavior are appropriate.  ____________________________________________   LABS (all labs ordered are listed, but only abnormal results are displayed)  Labs Reviewed - No data to display ____________________________________________  RADIOLOGY  No acute bony abnormality of the cervical spine.  I, , personally viewed and evaluated these images (plain radiographs) as part of my medical decision making, as well as reviewing the written report  by the radiologist.  DG Cervical Spine 2-3 Views  Result Date: 07/24/2020 CLINICAL DATA:  Right posterior neck pain for 7 days EXAM: CERVICAL SPINE - 2-3 VIEW COMPARISON:  05/04/2020 FINDINGS: Frontal and lateral views of the cervical spine are obtained. Alignment is anatomic to the cervicothoracic junction. There are no acute fractures. Disc spaces are well preserved. Mild diffuse facet hypertrophy. Prevertebral soft tissues are unremarkable. IMPRESSION: 1. Mild diffuse cervical facet hypertrophy. No acute bony abnormality. Electronically Signed   By: Sharlet Salina M.D.    On: 07/24/2020 16:29   ____________________________________________   PROCEDURES  Procedures  ____________________________________________   INITIAL IMPRESSION / ASSESSMENT AND PLAN / ED COURSE  Jesus Moon is a 50 y.o. who presents to the emergency department for evaluation of neck pain.  Spanish interpreter, [name] assisted with communication.  Patient states that the pain in his neck and behind his shoulder blade "takes his breath" sometimes, especially at night.  He denies feeling short of breath at rest or exertion. He denies chest pain. Patient also reports that he has had a lot of sinus drainage that is only relieved with Afrin. He used Afrin for 6 months. He has an appointment with ENT in the upcoming weeks. He is no longer using Afrin and feels that his nose is congested and he can not breathe well because of this. I advised him that this is a common occurrence after using Afrin for an extended period of time and strongly encouraged him to not use it unless advised by the specialist to do so. He is to finish the medrol dose pack as prescribed. Cetirizine added to daily medications.  Symptoms today most likely related to cervical strain/torticollis and will be treated with zanaflex.  Medications - No data to display  Pertinent labs & imaging results that were available during my care of the patient were reviewed by me and considered in my medical decision making (see chart for details).   _________________________________________   FINAL CLINICAL IMPRESSION(S) / ED DIAGNOSES  Final diagnoses:  Neck pain without injury    ED Discharge Orders          Ordered    tiZANidine (ZANAFLEX) 4 MG tablet  3 times daily        07/24/20 1713    cetirizine (ZYRTEC ALLERGY) 10 MG tablet  Daily        07/24/20 1713             If controlled substance prescribed during this visit, 12 month history viewed on the [facility] prior to issuing an initial prescription for  Schedule II or III opiod.    Chinita Pester, FNP 07/25/20 1248    Jene Every, MD 07/25/20 1251

## 2020-07-24 NOTE — ED Triage Notes (Addendum)
C/O right upper back pain x 7 days, then felt SOB.  SEen by a someone in [place] and prescribed prednisone.  Patient continues to have back pain and sob and nose congestion.  C?O a lot of phlegm in throat.  Denies cough.  Voice clear and strong.

## 2020-07-24 NOTE — ED Triage Notes (Signed)
C/O right upper back, right neck, right arm pain.    AAOx3.  Skin warm and dry. No SOB/ DOE.  NAD

## 2020-08-03 ENCOUNTER — Emergency Department: Payer: 59

## 2020-08-03 ENCOUNTER — Other Ambulatory Visit: Payer: Self-pay

## 2020-08-03 ENCOUNTER — Emergency Department
Admission: EM | Admit: 2020-08-03 | Discharge: 2020-08-03 | Disposition: A | Discharge status: Home / Self Care | Payer: 59 | Attending: Emergency Medicine | Admitting: Emergency Medicine

## 2020-08-03 DIAGNOSIS — M542 Cervicalgia: Secondary | ICD-10-CM | POA: Insufficient documentation

## 2020-08-03 DIAGNOSIS — M25512 Pain in left shoulder: Secondary | ICD-10-CM | POA: Insufficient documentation

## 2020-08-03 DIAGNOSIS — G8929 Other chronic pain: Secondary | ICD-10-CM | POA: Insufficient documentation

## 2020-08-03 DIAGNOSIS — R0789 Other chest pain: Secondary | ICD-10-CM | POA: Diagnosis not present

## 2020-08-03 DIAGNOSIS — M25511 Pain in right shoulder: Secondary | ICD-10-CM | POA: Diagnosis not present

## 2020-08-03 LAB — BASIC METABOLIC PANEL
Anion gap: 7 (ref 5–15)
BUN: 14 mg/dL (ref 6–20)
CO2: 24 mmol/L (ref 22–32)
Calcium: 8.7 mg/dL — ABNORMAL LOW (ref 8.9–10.3)
Chloride: 104 mmol/L (ref 98–111)
Creatinine, Ser: 0.71 mg/dL (ref 0.61–1.24)
GFR, Estimated: >60 mL/min (ref >60)
Glucose, Bld: 180 mg/dL — ABNORMAL HIGH (ref 70–99)
Potassium: 4 mmol/L (ref 3.5–5.1)
Sodium: 135 mmol/L (ref 135–145)

## 2020-08-03 LAB — CBC
HCT: 40.4 % (ref 39.0–52.0)
Hemoglobin: 14.3 g/dL (ref 13.0–17.0)
MCH: 31.9 pg (ref 26.0–34.0)
MCHC: 35.4 g/dL (ref 30.0–36.0)
MCV: 90.2 fL (ref 80.0–100.0)
Platelets: 213 10*3/uL (ref 150–400)
RBC: 4.48 MIL/uL (ref 4.22–5.81)
RDW: 12.6 % (ref 11.5–15.5)
WBC: 6.4 10*3/uL (ref 4.0–10.5)
nRBC: 0 % (ref 0.0–0.2)

## 2020-08-03 LAB — TROPONIN I (HIGH SENSITIVITY)
Troponin I (High Sensitivity): 4 ng/L (ref <18)
Troponin I (High Sensitivity): 4 ng/L (ref <18)

## 2020-08-03 MED ORDER — METHOCARBAMOL 500 MG PO TABS: 500.0000 mg | ORAL_TABLET | Freq: Three times a day (TID) | ORAL | 0 refills | Status: AC | PRN

## 2020-08-03 MED ORDER — ACETAMINOPHEN 500 MG PO TABS
1000.0000 mg | ORAL_TABLET | Freq: Once | ORAL | Status: AC
  Administered 2020-08-03: 1000 mg via ORAL
  Filled 2020-08-03: qty 2

## 2020-08-03 MED ORDER — METHOCARBAMOL 500 MG PO TABS
500.0000 mg | ORAL_TABLET | Freq: Once | ORAL | Status: AC
  Administered 2020-08-03: 500 mg via ORAL
  Filled 2020-08-03 (×2): qty 1

## 2020-08-03 MED ORDER — KETOROLAC TROMETHAMINE 30 MG/ML IJ SOLN
30.0000 mg | Freq: Once | INTRAMUSCULAR | Status: AC
  Administered 2020-08-03: 30 mg via INTRAMUSCULAR
  Filled 2020-08-03: qty 1

## 2020-08-03 NOTE — ED Notes (Signed)
ED Provider at bedside. 

## 2020-08-03 NOTE — ED Notes (Signed)
Interpreter Vivianne Master [phone]

## 2020-08-03 NOTE — ED Triage Notes (Signed)
Pt c/o constant epigastic/central CP that is tender to palpation. Pt states he has had the pain before and was prescribed lidocaine patches. Pt reports pulling something heavy yesterday when the pain started. Pt denies N/V/D, sob, and dizziness. Pt is AOX4, NAD noted.

## 2020-08-03 NOTE — ED Notes (Signed)
Interpreter Byrd Hesselbach [phone]

## 2020-08-03 NOTE — Discharge Instructions (Addendum)
Use Tylenol for pain and fevers.  Up to 1000 mg per dose, up to 4 times per day.  Do not take more than 4000 mg of Tylenol/acetaminophen within 24 hours..  Use naproxen/Aleve for anti-inflammatory pain relief. Use up to 500mg  every 12 hours. Do not take more frequently than this. Do not use other NSAIDs (ibuprofen, Advil) while taking this medication. It is safe to take Tylenol with this.   Use the Robaxin muscle relaxer that I have prescribed instead of the Flexeril/cyclobenzaprine medication.  Do not mix muscle relaxers.  Only use 1 muscle relaxer per day. It is safe to use muscle relaxers in addition to NSAIDs and Tylenol, as documented above.  Continue to use lidocaine patches as well.  12 hours on, 12 hours off.  Follow-up with your PCP to discuss cholesterol testing, as we discussed.

## 2020-08-03 NOTE — ED Provider Notes (Signed)
[facility] Emergency Department Provider Note ____________________________________________   Event Date/Time   First MD Initiated Contact with Patient 08/03/20 2151     (approximate)  I have reviewed the triage vital signs and the nursing notes.  HISTORY  Chief Complaint Chest Pain   HPI Jesus Moon is a 50 y.o. malewho presents to the ED for evaluation of chest pain.  Chart review indicates no history of cardiac disease.  Patient presents to the ED for evaluation of 3-4 months of chest pain, neck pain and shoulder pain.  He reports he feels pain intermittently, and often every day, precipitated by heavy lifting at his job.  He reports he feels pain to his bilateral shoulders, neck and chest when he lifts heavy objects in front of him.  He denies any falls, discrete trauma, syncopal episodes, fevers, emesis.   He reports using lidocaine patches and muscle relaxers with minimal improvement, and they just make him sleepy.  He is requesting cholesterol testing because he is curious.  He reports having a PCP appointment in September or October.  Past Medical History:  Diagnosis Date   Gastritis    High cholesterol    Hyperlipemia     There are no problems to display for this patient.   Past Surgical History:  Procedure Laterality Date   APPENDECTOMY     HERNIA REPAIR      Prior to Admission medications   Medication Sig Start Date End Date Taking? Authorizing Provider  methocarbamol (ROBAXIN) 500 MG tablet Take 1 tablet (500 mg total) by mouth every 8 (eight) hours as needed for muscle spasms. 08/03/20  Yes Delton Prairie, MD  cetirizine (ZYRTEC ALLERGY) 10 MG tablet Take 1 tablet (10 mg total) by mouth daily. 07/24/20 07/24/21  Triplett, Kasandra Knudsen, FNP  diphenhydrAMINE (BENADRYL ALLERGY) 25 mg capsule Take 1 capsule (25 mg total) by mouth every 6 (six) hours as needed for up to 5 days. 06/30/20 07/05/20  Orvil Feil, PA-C  famotidine  (PEPCID) 20 MG tablet Take 1 tablet (20 mg total) by mouth 2 (two) times daily for 7 days. 06/30/20 07/07/20  Orvil Feil, PA-C  fluticasone (FLONASE) 50 MCG/ACT nasal spray Place 2 sprays into both nostrils daily. 05/04/20   Menshew, Charlesetta Ivory, PA-C  tiZANidine (ZANAFLEX) 4 MG tablet Take 1 tablet (4 mg total) by mouth 3 (three) times daily. 07/24/20   Kem Boroughs B, FNP    Allergies Amoxicillin  History reviewed. No pertinent family history.  Social History Social History   Tobacco Use   Smoking status: Never   Smokeless tobacco: Never    Review of Systems  Constitutional: No fever/chills Eyes: No visual changes. ENT: No sore throat. Cardiovascular: Positive for chest pain. Respiratory: Denies shortness of breath. Gastrointestinal: No abdominal pain.  No nausea, no vomiting.  No diarrhea.  No constipation. Genitourinary: Negative for dysuria. Musculoskeletal: Positive for atraumatic shoulder, chest and neck pain bilaterally. Skin: Negative for rash. Neurological: Negative for headaches, focal weakness or numbness.  ____________________________________________   PHYSICAL EXAM:  VITAL SIGNS: Vitals:   08/03/20 2215 08/03/20 2230  BP:  129/87  Pulse: 64 (!) 59  Resp: 15 14  Temp:    SpO2: 99% 99%    Constitutional: Alert and oriented. Well appearing and in no acute distress. Eyes: Conjunctivae are normal. PERRL. EOMI. Head: Atraumatic. Nose: No congestion/rhinnorhea. Mouth/Throat: Mucous membranes are moist.  Oropharynx non-erythematous. Neck: No stridor. No cervical spine tenderness to palpation. Cardiovascular: Normal rate,  regular rhythm. Grossly normal heart sounds.  Good peripheral circulation. Respiratory: Normal respiratory effort.  No retractions. Lungs CTAB. Gastrointestinal: Soft , nondistended, nontender to palpation. No CVA tenderness. Musculoskeletal: No lower extremity tenderness nor edema.  No joint effusions. No signs of acute  trauma. Reproducible chest pain on palpation without overlying skin changes or signs of trauma.   Palpable muscular cord to bilateral trapezius musculature, left greater than right.  Similarly, no overlying skin changes or signs of trauma. No midline spinal tenderness or step-offs throughout the spine. No signs of trauma to the 4 extremities. Neurologic:  Normal speech and language. No gross focal neurologic deficits are appreciated. No gait instability noted. Cranial nerves II through XII intact 5/5 strength and sensation in all 4 extremities Skin:  Skin is warm, dry and intact. No rash noted. Psychiatric: Mood and affect are normal. Speech and behavior are normal. ____________________________________________   LABS (all labs ordered are listed, but only abnormal results are displayed)  Labs Reviewed  BASIC METABOLIC PANEL - Abnormal; Notable for the following components:      Result Value   Glucose, Bld 180 (*)    Calcium 8.7 (*)    All other components within normal limits  CBC  TROPONIN I (HIGH SENSITIVITY)  TROPONIN I (HIGH SENSITIVITY)   ____________________________________________  12 Lead EKG  Sinus rhythm, rate of 66 bpm.  Normal axis and intervals.  No evidence of acute ischemia. ____________________________________________  RADIOLOGY  ED MD interpretation: 2 view CXR reviewed by me without evidence of acute cardiopulmonary pathology.  Official radiology report(s): DG Chest 2 View  Result Date: 08/03/2020 CLINICAL DATA:  Chest pain.  Epigastric and central chest pain. EXAM: CHEST - 2 VIEW COMPARISON:  Radiograph 02/10/2020 FINDINGS: The cardiomediastinal contours are normal. The lungs are clear. Pulmonary vasculature is normal. No consolidation, pleural effusion, or pneumothorax. Minimal thoracic spondylosis with endplate spurring. No acute osseous abnormalities are seen. IMPRESSION: No acute chest findings. Electronically Signed   By: Narda Rutherford M.D.   On:  08/03/2020 21:30    ____________________________________________   PROCEDURES and INTERVENTIONS  Procedure(s) performed (including Critical Care):  .1-3 Lead EKG Interpretation  Date/Time: 08/03/2020 11:14 PM Performed by: Delton Prairie, MD Authorized by: Delton Prairie, MD     Interpretation: normal     ECG rate:  62   ECG rate assessment: normal     Rhythm: sinus rhythm     Ectopy: none     Conduction: normal    Medications  ketorolac (TORADOL) 30 MG/ML injection 30 mg (30 mg Intramuscular Given 08/03/20 2236)  acetaminophen (TYLENOL) tablet 1,000 mg (1,000 mg Oral Given 08/03/20 2233)  methocarbamol (ROBAXIN) tablet 500 mg (500 mg Oral Given 08/03/20 2235)    ____________________________________________   MDM / ED COURSE   50 year old male presents to the ED with chronic and atypical chest pains, amenable to outpatient management.  Normal vitals.  Exam with stigmata of muscular spasm to his trapezius muscles, primarily to the left.  Similarly has reproducible chest pain without overlying skin changes to suggest herpes zoster, or any signs of trauma.  Work-up is benign without evidence of ACS, PTX, CAP or electrolyte derangements.  Improving symptoms with anti-inflammatories and multimodal nonnarcotic pain control.  We will discharge with recommendations for the same at home and return precautions for the ED were discussed.     ____________________________________________   FINAL CLINICAL IMPRESSION(S) / ED DIAGNOSES  Final diagnoses:  Other chest pain     ED Discharge Orders  Ordered    methocarbamol (ROBAXIN) 500 MG tablet  Every 8 hours PRN        08/03/20 2311             Jesus Moon   Note:  This document was prepared using Dragon voice recognition software and may include unintentional dictation errors.    Delton Prairie, MD 08/03/20 [phone]

## 2020-08-15 ENCOUNTER — Other Ambulatory Visit: Payer: Self-pay

## 2020-08-15 DIAGNOSIS — Z5321 Procedure and treatment not carried out due to patient leaving prior to being seen by health care provider: Secondary | ICD-10-CM | POA: Insufficient documentation

## 2020-08-15 DIAGNOSIS — R040 Epistaxis: Secondary | ICD-10-CM | POA: Insufficient documentation

## 2020-08-15 NOTE — ED Triage Notes (Signed)
Pt states he had surgery on his nose today and now it is swollen and he is having a hard time breathing. Pt states it is dripping blood. Pt states he was not told how long it would be bleeding. Pt states he feels congested. Pt with sats of 97-98% on room air.Pt given verbal reassurance that he is getting enough oxygen because he is breathing through his mouth Pt states he had surgery because of a deviated septum.

## 2020-08-16 ENCOUNTER — Emergency Department
Admission: EM | Admit: 2020-08-16 | Discharge: 2020-08-16 | Disposition: A | Discharge status: Home / Self Care | Payer: 59 | Attending: Emergency Medicine | Admitting: Emergency Medicine

## 2021-01-18 DIAGNOSIS — R079 Chest pain, unspecified: Secondary | ICD-10-CM | POA: Diagnosis not present

## 2021-01-18 DIAGNOSIS — R0781 Pleurodynia: Secondary | ICD-10-CM | POA: Diagnosis not present

## 2021-01-19 DIAGNOSIS — R079 Chest pain, unspecified: Secondary | ICD-10-CM | POA: Diagnosis not present

## 2021-02-21 DIAGNOSIS — M545 Low back pain, unspecified: Secondary | ICD-10-CM | POA: Diagnosis not present

## 2021-02-21 DIAGNOSIS — M47816 Spondylosis without myelopathy or radiculopathy, lumbar region: Secondary | ICD-10-CM | POA: Diagnosis not present

## 2021-02-27 DIAGNOSIS — B356 Tinea cruris: Secondary | ICD-10-CM | POA: Diagnosis not present

## 2021-02-27 DIAGNOSIS — R0781 Pleurodynia: Secondary | ICD-10-CM | POA: Diagnosis not present

## 2021-02-27 DIAGNOSIS — Z1159 Encounter for screening for other viral diseases: Secondary | ICD-10-CM | POA: Diagnosis not present

## 2021-02-27 DIAGNOSIS — M5441 Lumbago with sciatica, right side: Secondary | ICD-10-CM | POA: Diagnosis not present

## 2021-02-27 DIAGNOSIS — D72829 Elevated white blood cell count, unspecified: Secondary | ICD-10-CM | POA: Diagnosis not present

## 2021-02-27 DIAGNOSIS — Z23 Encounter for immunization: Secondary | ICD-10-CM | POA: Diagnosis not present

## 2021-02-27 DIAGNOSIS — J301 Allergic rhinitis due to pollen: Secondary | ICD-10-CM | POA: Diagnosis not present

## 2021-02-27 DIAGNOSIS — M5442 Lumbago with sciatica, left side: Secondary | ICD-10-CM | POA: Diagnosis not present

## 2021-02-27 DIAGNOSIS — N485 Ulcer of penis: Secondary | ICD-10-CM | POA: Diagnosis not present

## 2021-02-27 DIAGNOSIS — R7303 Prediabetes: Secondary | ICD-10-CM | POA: Diagnosis not present

## 2021-02-27 DIAGNOSIS — R768 Other specified abnormal immunological findings in serum: Secondary | ICD-10-CM | POA: Diagnosis not present

## 2021-02-27 DIAGNOSIS — E782 Mixed hyperlipidemia: Secondary | ICD-10-CM | POA: Diagnosis not present

## 2021-03-08 DIAGNOSIS — M4807 Spinal stenosis, lumbosacral region: Secondary | ICD-10-CM | POA: Diagnosis not present

## 2021-03-08 DIAGNOSIS — M5416 Radiculopathy, lumbar region: Secondary | ICD-10-CM | POA: Diagnosis not present

## 2021-04-08 DIAGNOSIS — M4807 Spinal stenosis, lumbosacral region: Secondary | ICD-10-CM | POA: Diagnosis not present

## 2021-04-18 DIAGNOSIS — M5416 Radiculopathy, lumbar region: Secondary | ICD-10-CM | POA: Diagnosis not present

## 2021-07-04 DIAGNOSIS — E782 Mixed hyperlipidemia: Secondary | ICD-10-CM | POA: Diagnosis not present

## 2021-07-04 DIAGNOSIS — L298 Other pruritus: Secondary | ICD-10-CM | POA: Diagnosis not present

## 2021-07-04 DIAGNOSIS — Z1331 Encounter for screening for depression: Secondary | ICD-10-CM | POA: Diagnosis not present

## 2021-07-04 DIAGNOSIS — R7303 Prediabetes: Secondary | ICD-10-CM | POA: Diagnosis not present

## 2021-07-04 DIAGNOSIS — R768 Other specified abnormal immunological findings in serum: Secondary | ICD-10-CM | POA: Diagnosis not present

## 2021-07-04 DIAGNOSIS — J301 Allergic rhinitis due to pollen: Secondary | ICD-10-CM | POA: Diagnosis not present

## 2021-07-04 DIAGNOSIS — B356 Tinea cruris: Secondary | ICD-10-CM | POA: Diagnosis not present

## 2021-10-08 DIAGNOSIS — Z20822 Contact with and (suspected) exposure to covid-19: Secondary | ICD-10-CM | POA: Diagnosis not present

## 2021-10-08 DIAGNOSIS — R058 Other specified cough: Secondary | ICD-10-CM | POA: Diagnosis not present

## 2021-10-08 DIAGNOSIS — R079 Chest pain, unspecified: Secondary | ICD-10-CM | POA: Diagnosis not present

## 2021-10-30 DIAGNOSIS — E782 Mixed hyperlipidemia: Secondary | ICD-10-CM | POA: Diagnosis not present

## 2021-10-30 DIAGNOSIS — B356 Tinea cruris: Secondary | ICD-10-CM | POA: Diagnosis not present

## 2021-10-30 DIAGNOSIS — Z1331 Encounter for screening for depression: Secondary | ICD-10-CM | POA: Diagnosis not present

## 2021-10-30 DIAGNOSIS — J301 Allergic rhinitis due to pollen: Secondary | ICD-10-CM | POA: Diagnosis not present

## 2021-10-30 DIAGNOSIS — M546 Pain in thoracic spine: Secondary | ICD-10-CM | POA: Diagnosis not present

## 2021-10-30 DIAGNOSIS — R768 Other specified abnormal immunological findings in serum: Secondary | ICD-10-CM | POA: Diagnosis not present

## 2022-08-05 DIAGNOSIS — Z1331 Encounter for screening for depression: Secondary | ICD-10-CM | POA: Diagnosis not present

## 2022-08-05 DIAGNOSIS — F17200 Nicotine dependence, unspecified, uncomplicated: Secondary | ICD-10-CM | POA: Diagnosis not present

## 2022-08-05 DIAGNOSIS — Z125 Encounter for screening for malignant neoplasm of prostate: Secondary | ICD-10-CM | POA: Diagnosis not present

## 2022-08-05 DIAGNOSIS — F419 Anxiety disorder, unspecified: Secondary | ICD-10-CM | POA: Diagnosis not present

## 2022-08-05 DIAGNOSIS — H9192 Unspecified hearing loss, left ear: Secondary | ICD-10-CM | POA: Diagnosis not present

## 2022-08-05 DIAGNOSIS — R7303 Prediabetes: Secondary | ICD-10-CM | POA: Diagnosis not present

## 2022-08-05 DIAGNOSIS — Z1330 Encounter for screening examination for mental health and behavioral disorders, unspecified: Secondary | ICD-10-CM | POA: Diagnosis not present

## 2022-08-05 DIAGNOSIS — R768 Other specified abnormal immunological findings in serum: Secondary | ICD-10-CM | POA: Diagnosis not present

## 2022-08-05 DIAGNOSIS — E119 Type 2 diabetes mellitus without complications: Secondary | ICD-10-CM | POA: Diagnosis not present

## 2022-08-05 DIAGNOSIS — E782 Mixed hyperlipidemia: Secondary | ICD-10-CM | POA: Diagnosis not present

## 2022-10-27 DIAGNOSIS — M542 Cervicalgia: Secondary | ICD-10-CM | POA: Diagnosis not present

## 2022-10-27 DIAGNOSIS — Z7982 Long term (current) use of aspirin: Secondary | ICD-10-CM | POA: Diagnosis not present

## 2022-10-27 DIAGNOSIS — Z1152 Encounter for screening for COVID-19: Secondary | ICD-10-CM | POA: Diagnosis not present

## 2022-10-27 DIAGNOSIS — R519 Headache, unspecified: Secondary | ICD-10-CM | POA: Diagnosis not present

## 2022-10-27 DIAGNOSIS — M545 Low back pain, unspecified: Secondary | ICD-10-CM | POA: Diagnosis not present

## 2022-10-27 DIAGNOSIS — E785 Hyperlipidemia, unspecified: Secondary | ICD-10-CM | POA: Diagnosis not present

## 2022-10-27 DIAGNOSIS — M62838 Other muscle spasm: Secondary | ICD-10-CM | POA: Diagnosis not present

## 2022-10-27 DIAGNOSIS — Z880 Allergy status to penicillin: Secondary | ICD-10-CM | POA: Diagnosis not present

## 2022-12-06 DIAGNOSIS — R001 Bradycardia, unspecified: Secondary | ICD-10-CM | POA: Diagnosis not present

## 2022-12-06 DIAGNOSIS — M7918 Myalgia, other site: Secondary | ICD-10-CM | POA: Diagnosis not present

## 2022-12-06 DIAGNOSIS — M79602 Pain in left arm: Secondary | ICD-10-CM | POA: Diagnosis not present

## 2022-12-06 DIAGNOSIS — R079 Chest pain, unspecified: Secondary | ICD-10-CM | POA: Diagnosis not present

## 2022-12-06 DIAGNOSIS — R0789 Other chest pain: Secondary | ICD-10-CM | POA: Diagnosis not present

## 2023-01-04 DIAGNOSIS — E782 Mixed hyperlipidemia: Secondary | ICD-10-CM | POA: Diagnosis not present

## 2023-01-04 DIAGNOSIS — E119 Type 2 diabetes mellitus without complications: Secondary | ICD-10-CM | POA: Diagnosis not present

## 2023-03-26 IMAGING — CR DG ELBOW 2V*R*
2 series · 2 of 2 positions shown · non-contrast
Comparison: None.

CLINICAL DATA: Elbow pain

EXAM:
RIGHT ELBOW - 2 VIEW

[elbow ap]
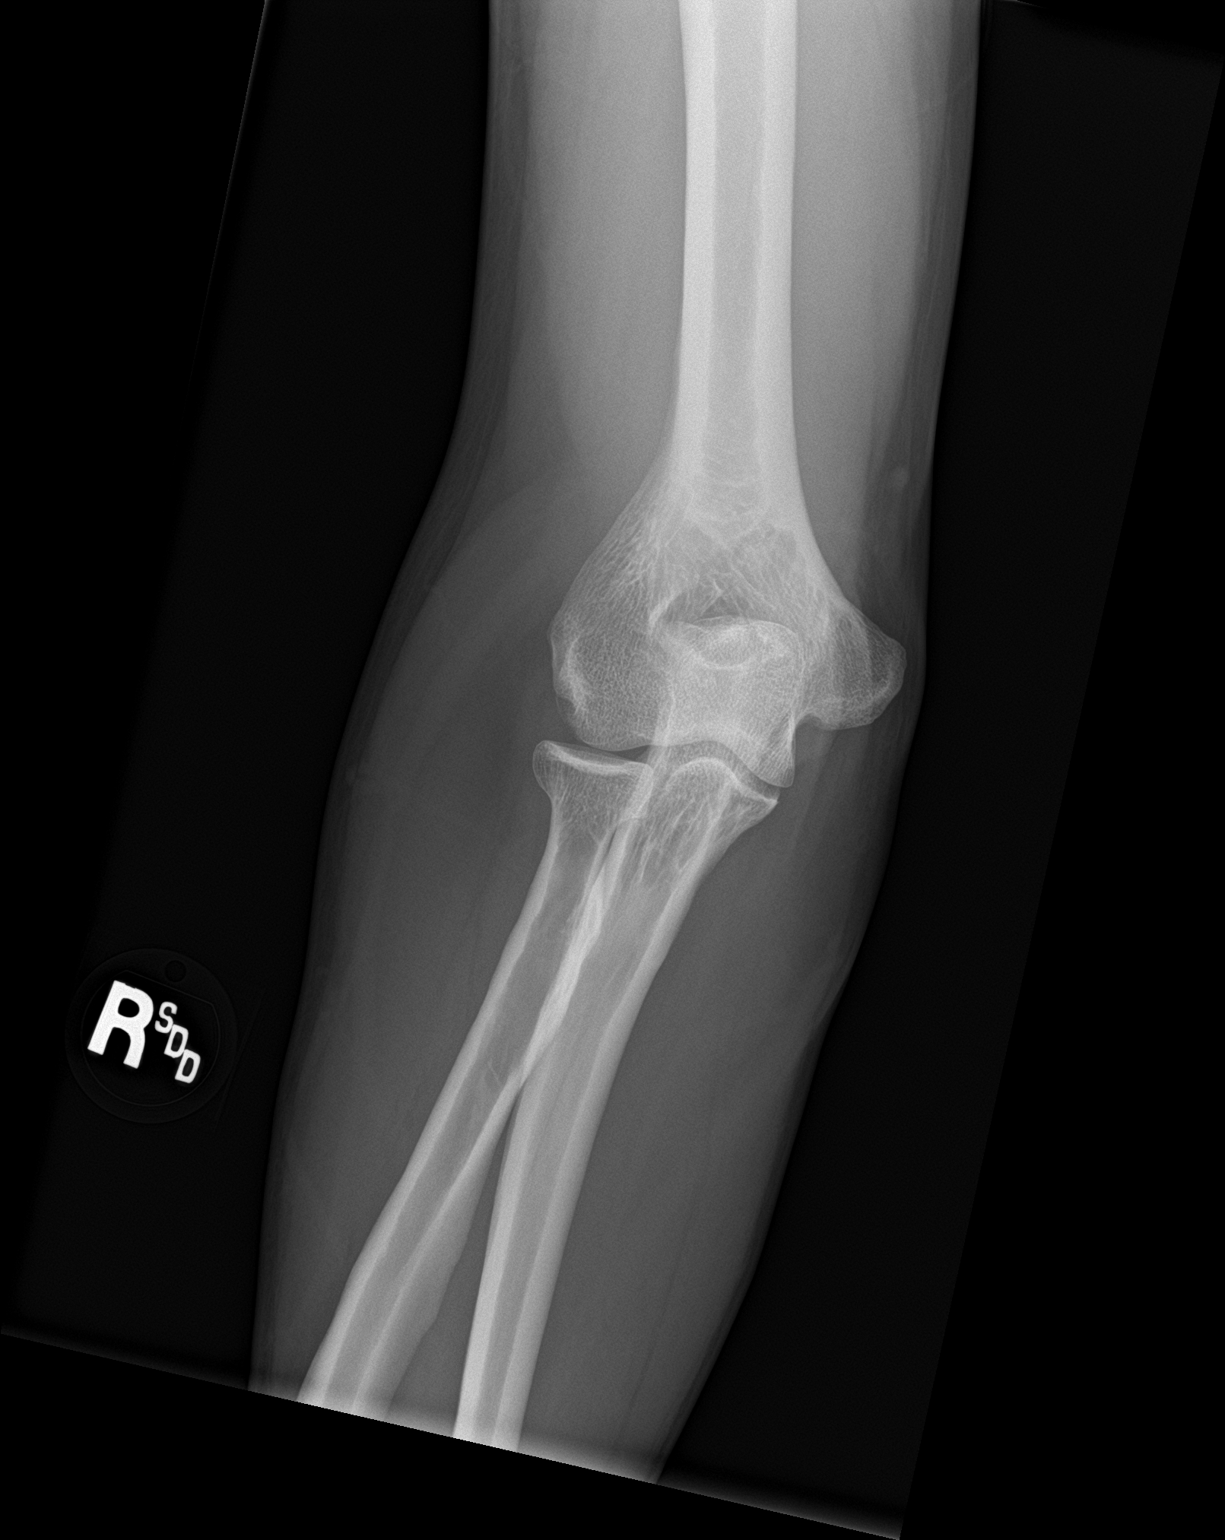

[elbow lat]
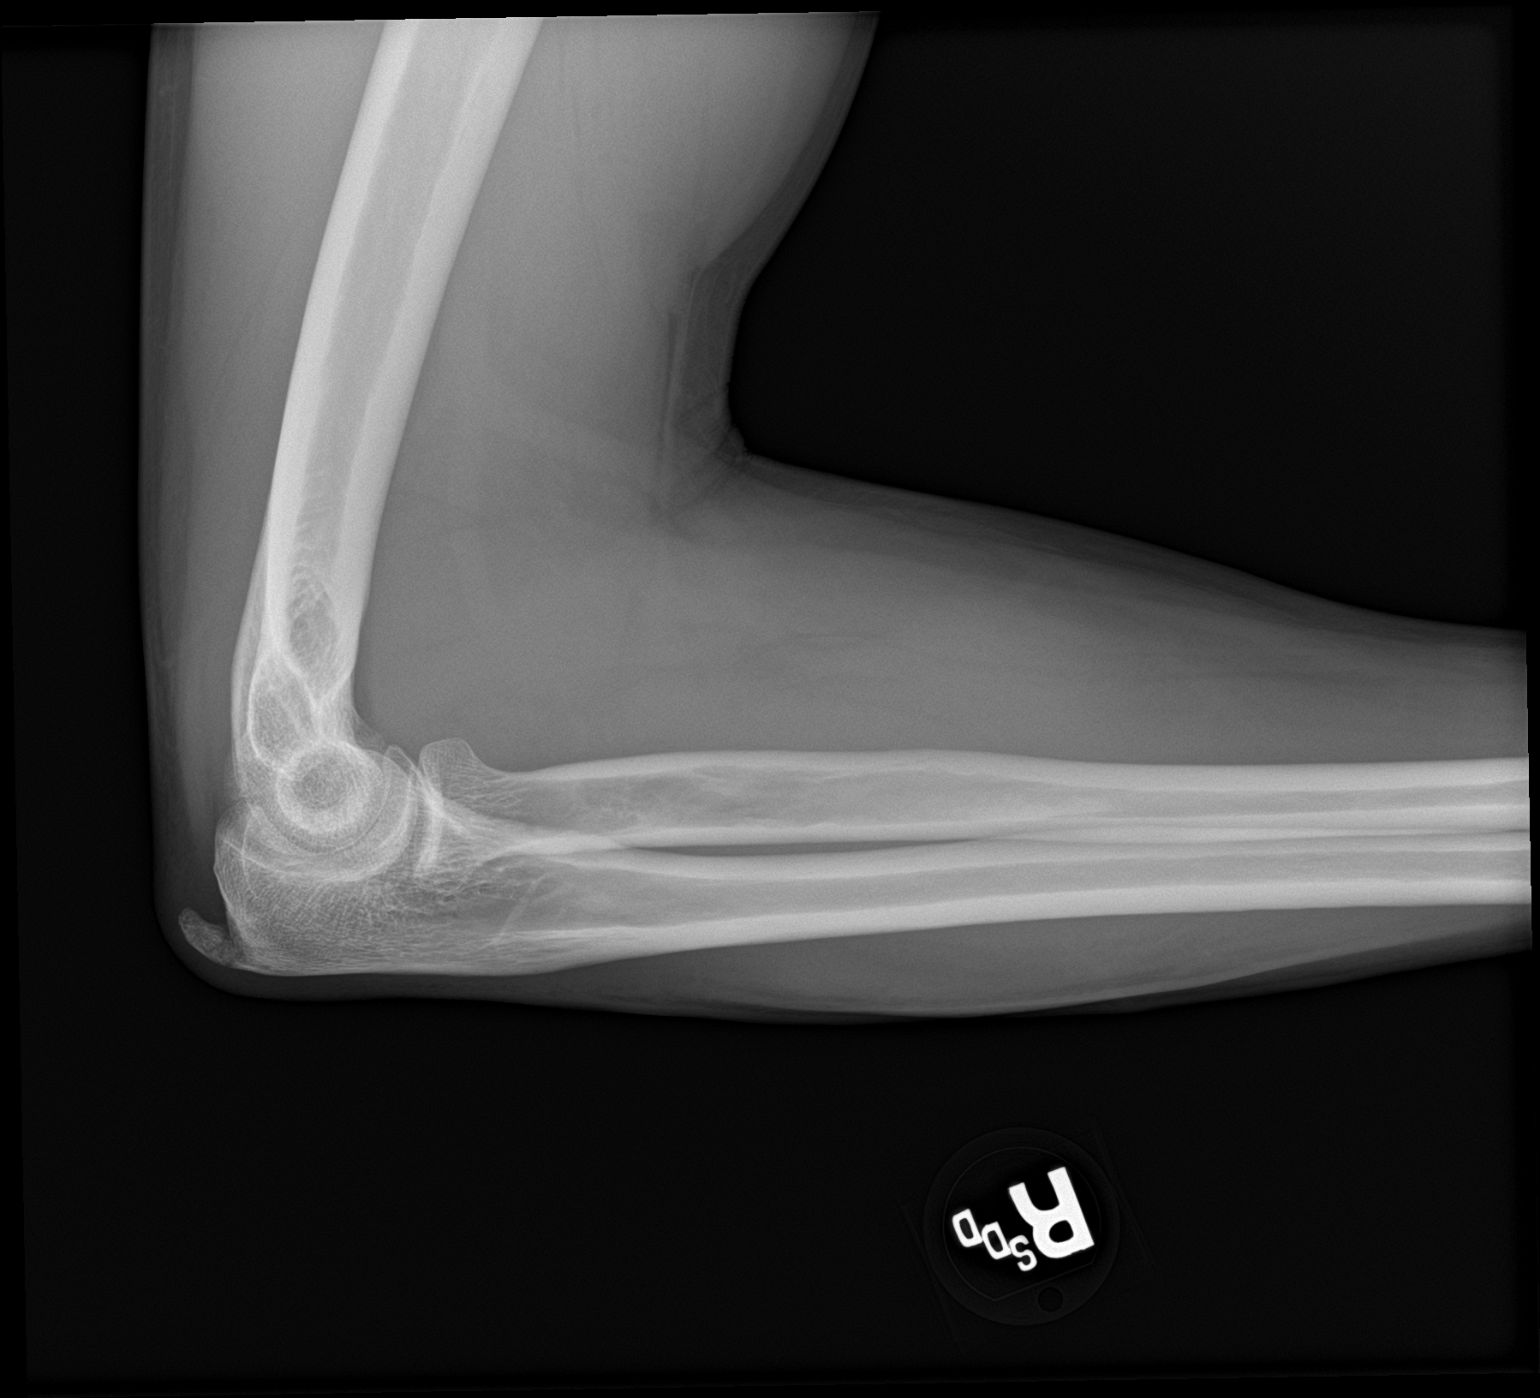

[2 of 2 positions shown; findings below may reference images not displayed]

FINDINGS: Large enthesopathic spurring noted upon the olecranon with some
lucencies through the base of this ossification which could reflect
age-indeterminate fragmentation with minimal overlying soft tissue
thickening. No other acute or conspicuous osseous abnormality at the
elbow. No sizeable joint effusion. Remaining soft tissues are
unremarkable.
IMPRESSION: Enthesopathic spur upon the olecranon. Lucency through the base
could reflect age-indeterminate fragmentation with overlying soft
tissue thickening. Correlate for point tenderness to assess for
acuity.

No other acute osseous or soft tissue abnormality.

## 2023-06-15 IMAGING — CR DG CERVICAL SPINE 2 OR 3 VIEWS
1 series · 3 of 3 positions shown · non-contrast
Comparison: 05/04/2020

CLINICAL DATA: Right posterior neck pain for 7 days

EXAM:
CERVICAL SPINE - 2-3 VIEW

[Series 1: dg cervical spine 2 or 3 views · 0.14mm/px · 3 of 3 slices shown]
[im 1/3]
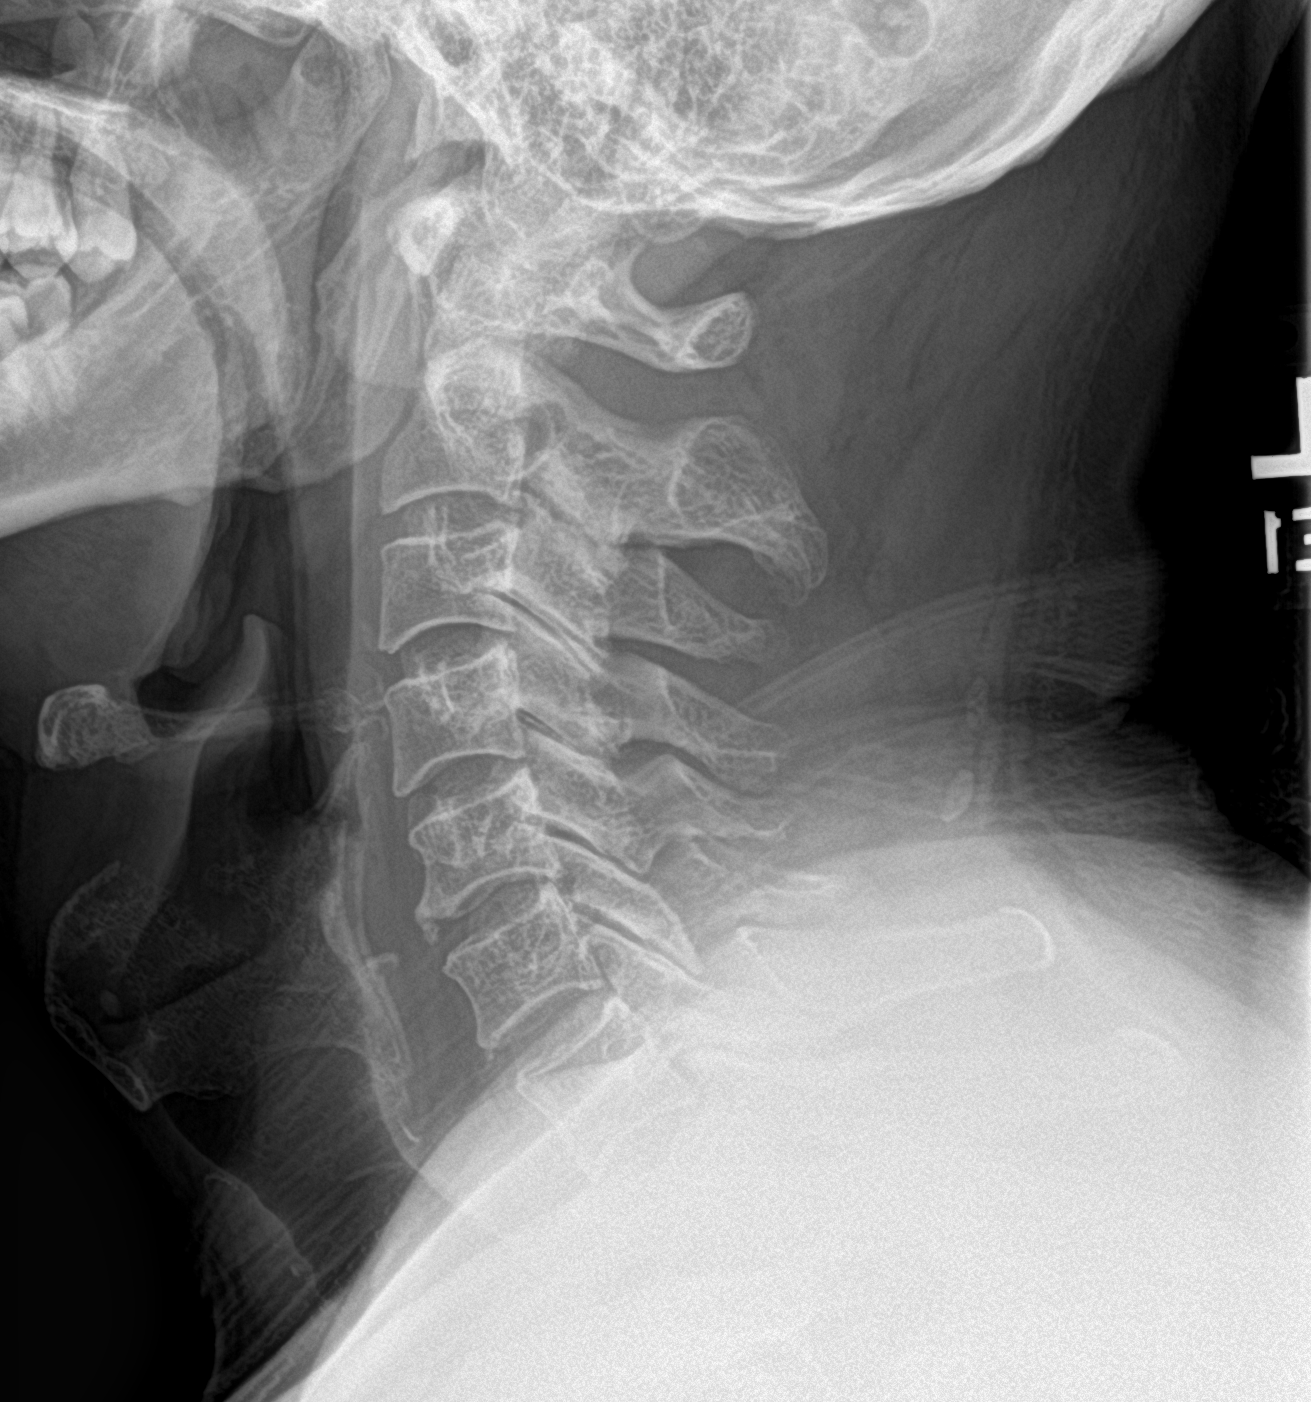
[im 2/3]
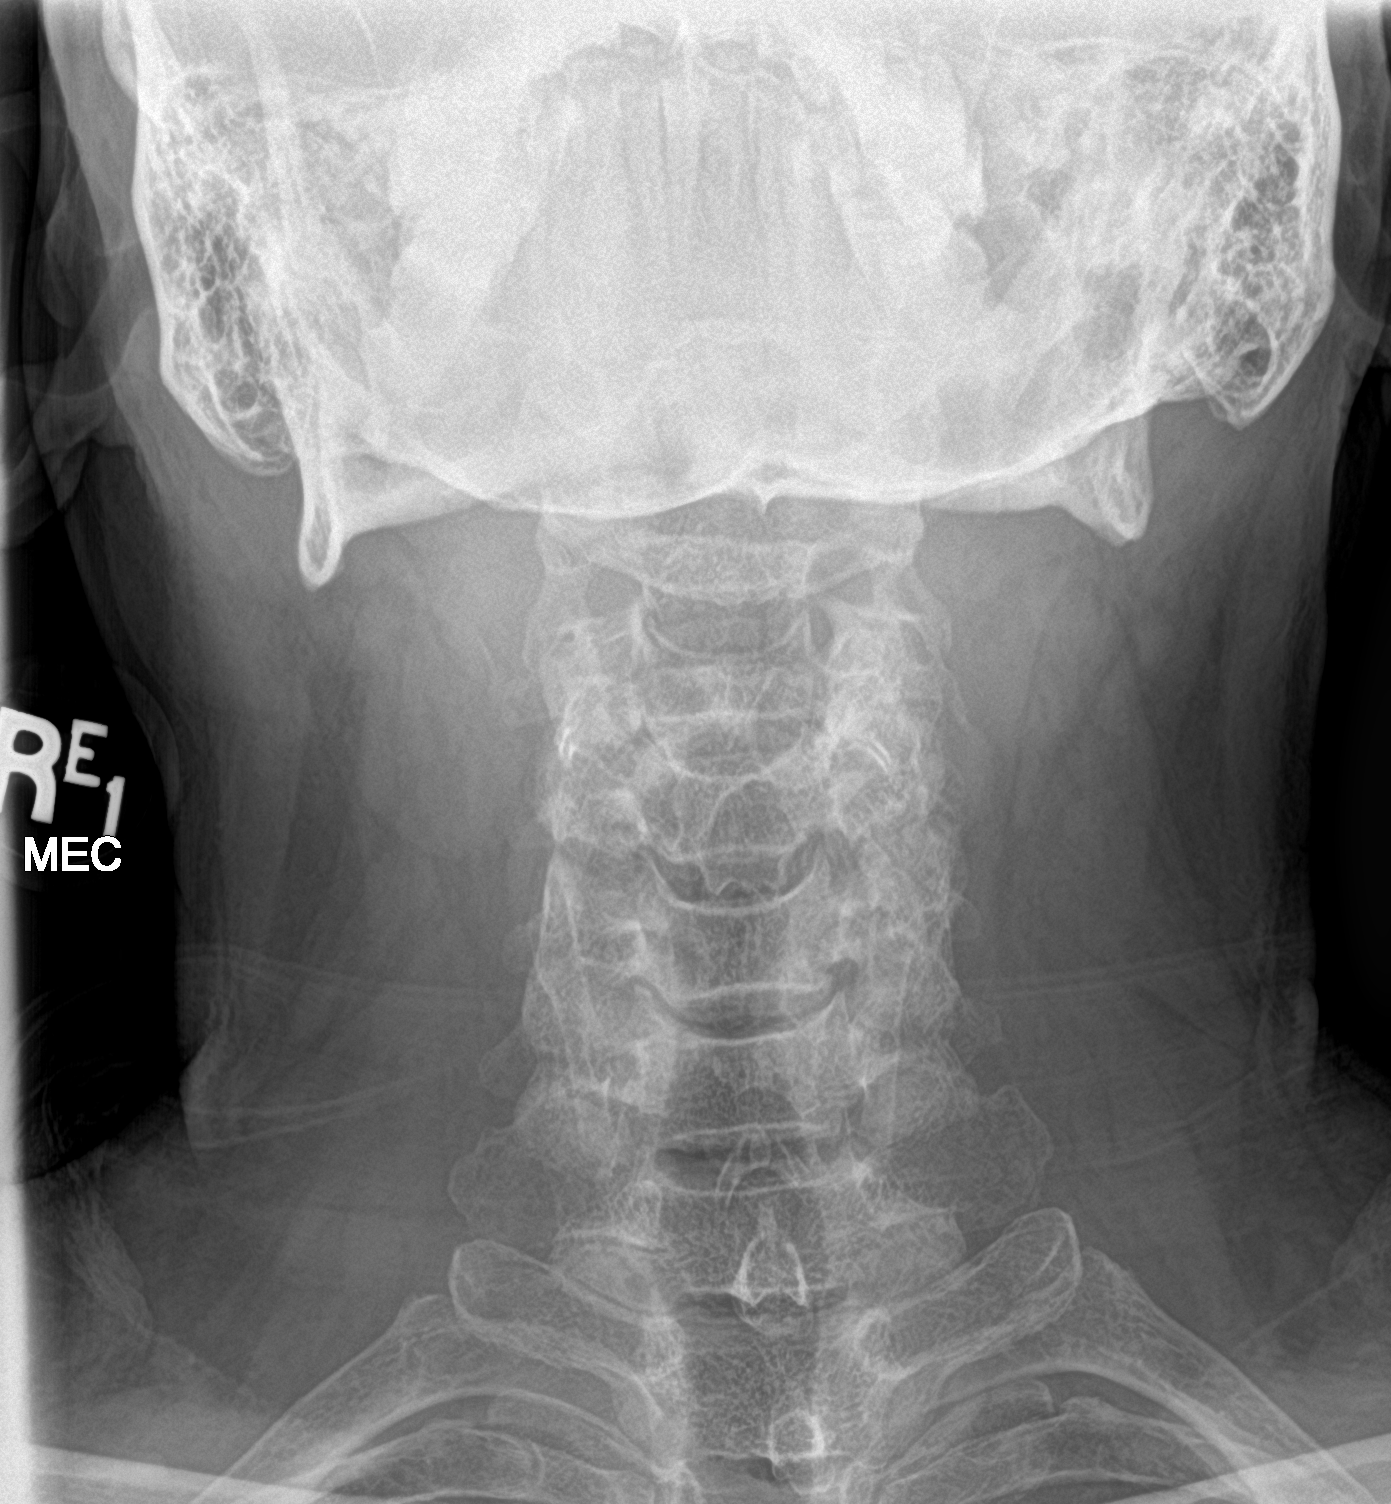
[im 3/3]
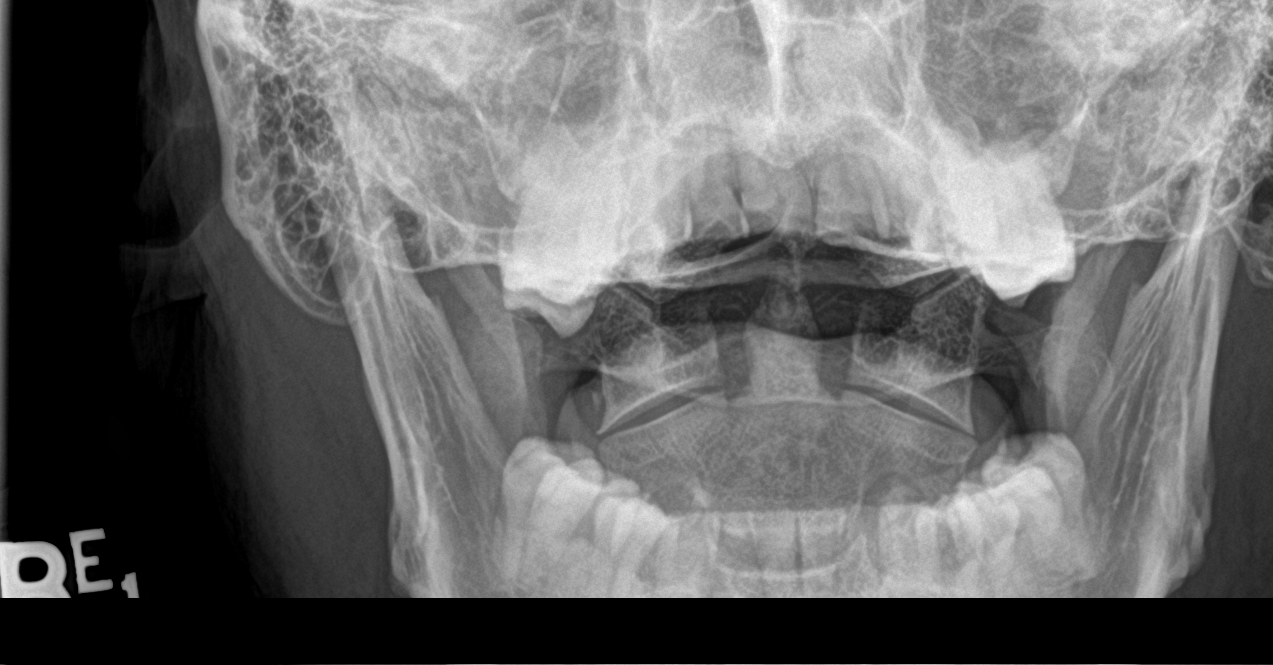

[3 of 3 positions shown; findings below may reference images not displayed]

FINDINGS: Frontal and lateral views of the cervical spine are obtained.
Alignment is anatomic to the cervicothoracic junction. There are no
acute fractures. Disc spaces are well preserved. Mild diffuse facet
hypertrophy. Prevertebral soft tissues are unremarkable.
IMPRESSION: 1. Mild diffuse cervical facet hypertrophy. No acute bony
abnormality.

## 2023-10-14 ENCOUNTER — Ambulatory Visit: Payer: Self-pay | Admitting: Pediatrics
